# Patient Record
Sex: Female | Born: 1950
Health system: Southern US, Community
[De-identification: ages and names within clinical notes are randomized; demographics above are authoritative.]

## PROBLEM LIST (undated history)

## (undated) DIAGNOSIS — I10 Essential (primary) hypertension: Secondary | ICD-10-CM

## (undated) DIAGNOSIS — N390 Urinary tract infection, site not specified: Secondary | ICD-10-CM

## (undated) DIAGNOSIS — Z9889 Other specified postprocedural states: Secondary | ICD-10-CM

## (undated) DIAGNOSIS — Z9289 Personal history of other medical treatment: Secondary | ICD-10-CM

## (undated) DIAGNOSIS — T8859XA Other complications of anesthesia, initial encounter: Secondary | ICD-10-CM

## (undated) DIAGNOSIS — T4145XA Adverse effect of unspecified anesthetic, initial encounter: Secondary | ICD-10-CM

## (undated) DIAGNOSIS — N319 Neuromuscular dysfunction of bladder, unspecified: Secondary | ICD-10-CM

## (undated) DIAGNOSIS — R51 Headache: Secondary | ICD-10-CM

## (undated) DIAGNOSIS — M419 Scoliosis, unspecified: Secondary | ICD-10-CM

## (undated) DIAGNOSIS — R011 Cardiac murmur, unspecified: Secondary | ICD-10-CM

## (undated) DIAGNOSIS — R112 Nausea with vomiting, unspecified: Secondary | ICD-10-CM

## (undated) HISTORY — PX: OTHER SURGICAL HISTORY: SHX169

## (undated) HISTORY — PX: BACK SURGERY: SHX140

---

## 1971-11-26 HISTORY — PX: TONSILLECTOMY: SUR1361

## 1978-11-25 HISTORY — PX: OTHER SURGICAL HISTORY: SHX169

## 1988-11-25 HISTORY — PX: BREAST SURGERY: SHX581

## 1989-11-25 HISTORY — PX: ANTERIOR RELEASE VERTEBRAL BODY W/ POSTERIOR FUSION: SUR290

## 1989-11-25 HISTORY — PX: CHOLECYSTECTOMY: SHX55

## 1993-11-25 HISTORY — PX: ANTERIOR AND POSTERIOR SPINAL FUSION: SHX2259

## 2002-11-25 HISTORY — PX: DILATION AND CURETTAGE OF UTERUS: SHX78

## 2002-11-25 HISTORY — PX: HYSTEROSCOPY: SHX211

## 2003-11-07 ENCOUNTER — Other Ambulatory Visit: Payer: Self-pay

## 2003-11-26 HISTORY — PX: OTHER SURGICAL HISTORY: SHX169

## 2003-11-26 HISTORY — PX: ELBOW WEDGE OSTEOTOMY: SHX1494

## 2004-11-25 HISTORY — PX: HARDWARE REMOVAL: SHX979

## 2004-12-11 ENCOUNTER — Ambulatory Visit: Payer: Self-pay | Admitting: Unknown Physician Specialty

## 2005-05-25 ENCOUNTER — Ambulatory Visit: Payer: Self-pay | Admitting: Internal Medicine

## 2005-09-05 ENCOUNTER — Ambulatory Visit: Payer: Self-pay | Admitting: General Practice

## 2006-08-11 ENCOUNTER — Ambulatory Visit: Payer: Self-pay

## 2006-11-05 ENCOUNTER — Encounter: Payer: Self-pay | Admitting: General Practice

## 2006-11-25 ENCOUNTER — Encounter: Payer: Self-pay | Admitting: General Practice

## 2006-11-25 HISTORY — PX: JOINT REPLACEMENT: SHX530

## 2006-12-08 ENCOUNTER — Other Ambulatory Visit: Payer: Self-pay

## 2006-12-22 ENCOUNTER — Ambulatory Visit: Payer: Self-pay | Admitting: Family Medicine

## 2006-12-24 ENCOUNTER — Inpatient Hospital Stay: Payer: Self-pay | Admitting: General Practice

## 2006-12-26 ENCOUNTER — Encounter: Payer: Self-pay | Admitting: General Practice

## 2007-07-09 ENCOUNTER — Emergency Department: Payer: Self-pay

## 2007-07-09 ENCOUNTER — Other Ambulatory Visit: Payer: Self-pay

## 2007-07-16 ENCOUNTER — Ambulatory Visit: Payer: Self-pay | Admitting: Orthopedic Surgery

## 2007-09-16 ENCOUNTER — Ambulatory Visit: Payer: Self-pay | Admitting: Family Medicine

## 2007-11-27 ENCOUNTER — Ambulatory Visit: Payer: Self-pay | Admitting: Orthopedic Surgery

## 2008-07-26 ENCOUNTER — Ambulatory Visit: Payer: Self-pay

## 2008-08-16 ENCOUNTER — Ambulatory Visit: Payer: Self-pay | Admitting: Podiatry

## 2008-09-29 ENCOUNTER — Ambulatory Visit: Payer: Self-pay

## 2008-10-21 ENCOUNTER — Ambulatory Visit: Payer: Self-pay | Admitting: Unknown Physician Specialty

## 2008-11-25 HISTORY — PX: OTHER SURGICAL HISTORY: SHX169

## 2008-12-28 IMAGING — CR RIGHT HIP - COMPLETE 2+ VIEW
1 series · 1 of 1 positions shown · non-contrast
Comparison: none

REASON FOR EXAM: Postop
COMMENTS:  Bedside (portable):Y

PROCEDURE:     DXR - DXR HIP RIGHT COMPLETE  - December 24, 2006 [DATE]
RESULT:       The patient has had a RIGHT hip replacement.  There is no
evidence of fracture or dislocation.  The prosthetic device is in good
anatomic position.

[view not recorded]
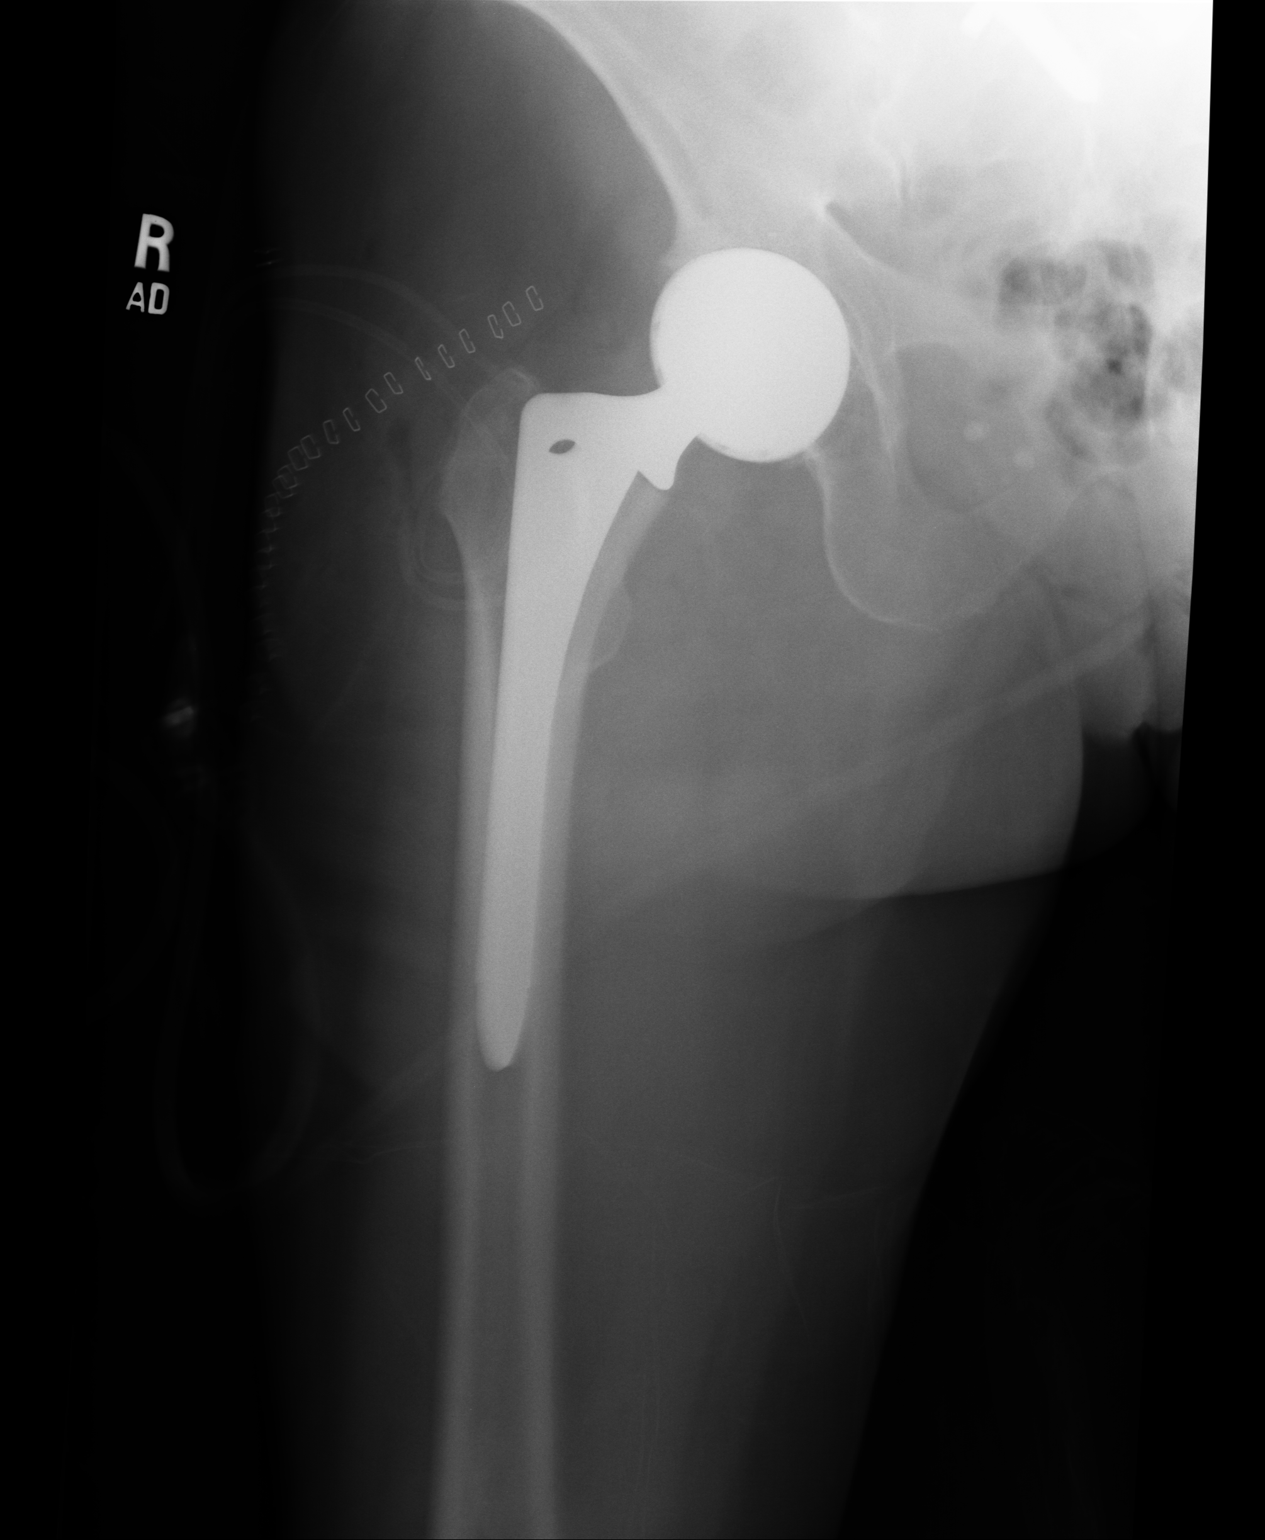

[1 of 1 positions shown; findings below may reference images not displayed]

IMPRESSION: The patient is status post RIGHT hip replacement.  Good anatomic positioning
is noted.

## 2009-01-09 ENCOUNTER — Ambulatory Visit: Payer: Self-pay

## 2009-03-13 ENCOUNTER — Ambulatory Visit: Payer: Self-pay | Admitting: Anesthesiology

## 2009-03-20 ENCOUNTER — Ambulatory Visit: Payer: Self-pay | Admitting: Internal Medicine

## 2009-03-23 ENCOUNTER — Inpatient Hospital Stay: Payer: Self-pay | Admitting: Orthopedic Surgery

## 2009-04-19 ENCOUNTER — Ambulatory Visit: Payer: Self-pay | Admitting: Anesthesiology

## 2009-10-10 ENCOUNTER — Emergency Department: Payer: Self-pay | Admitting: Emergency Medicine

## 2009-10-27 ENCOUNTER — Ambulatory Visit: Payer: Self-pay | Admitting: Podiatry

## 2009-11-22 ENCOUNTER — Ambulatory Visit: Payer: Self-pay | Admitting: Family Medicine

## 2009-11-25 HISTORY — PX: TENDON RELEASE: SHX230

## 2009-11-25 HISTORY — PX: REVISION TOTAL HIP ARTHROPLASTY: SHX766

## 2009-11-25 HISTORY — PX: ORIF METATARSAL FRACTURE: SUR942

## 2009-12-04 ENCOUNTER — Ambulatory Visit: Payer: Self-pay | Admitting: Family Medicine

## 2009-12-21 ENCOUNTER — Ambulatory Visit: Payer: Self-pay | Admitting: Podiatry

## 2010-04-26 ENCOUNTER — Ambulatory Visit: Payer: Self-pay | Admitting: Anesthesiology

## 2010-04-26 ENCOUNTER — Ambulatory Visit: Payer: Self-pay | Admitting: Podiatry

## 2010-05-14 ENCOUNTER — Ambulatory Visit: Payer: Self-pay | Admitting: Podiatry

## 2010-06-05 ENCOUNTER — Ambulatory Visit: Payer: Self-pay | Admitting: Family Medicine

## 2010-07-09 ENCOUNTER — Encounter: Payer: Self-pay | Admitting: Podiatry

## 2010-07-26 ENCOUNTER — Encounter: Payer: Self-pay | Admitting: Podiatry

## 2010-08-30 ENCOUNTER — Ambulatory Visit: Payer: Self-pay | Admitting: General Practice

## 2010-09-12 ENCOUNTER — Inpatient Hospital Stay: Payer: Self-pay | Admitting: General Practice

## 2011-07-30 ENCOUNTER — Ambulatory Visit: Payer: Self-pay | Admitting: Gastroenterology

## 2011-08-02 LAB — PATHOLOGY REPORT

## 2011-10-01 ENCOUNTER — Ambulatory Visit: Payer: Self-pay | Admitting: Family Medicine

## 2011-10-15 IMAGING — CT CT HEAD WITHOUT CONTRAST
2 series · 16 of 30 positions shown, 20 images · non-contrast
Comparison: none

REASON FOR EXAM: occipital hematoma, headache
COMMENTS:

PROCEDURE:     CT  - CT HEAD WITHOUT CONTRAST  - October 10, 2009 [DATE]
RESULT:     Technique: Helical 5mm sections were obtained from the skull
base to the vertex without administration of intravenous contrast.

[Series 2: without · axial · non-contrast · 0.44mm/px · z∈[+430,+564]mm · 13 of 33 slices shown, 17 images]
[im 3/33  brain]
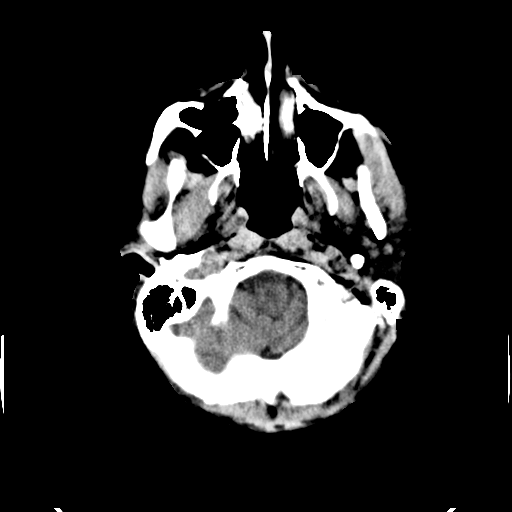
[im 3/33  bone]
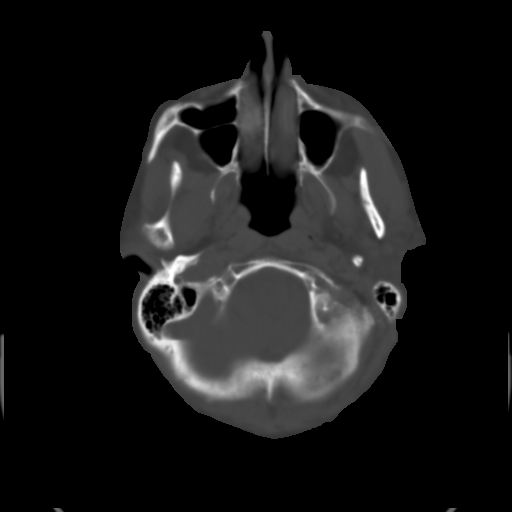
[im 5/33  brain]
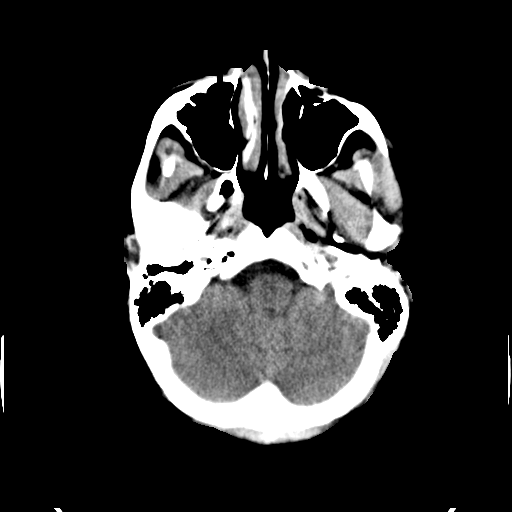
[im 7/33  brain]
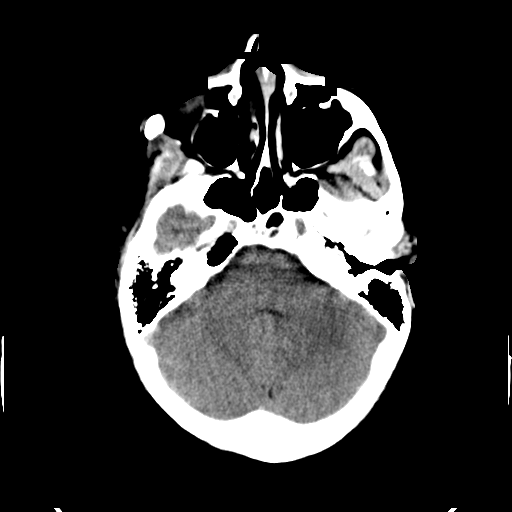
[im 10/33  brain]
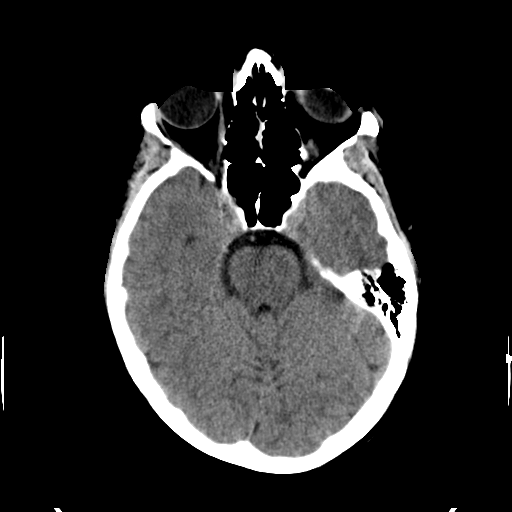
[im 12/33  brain]
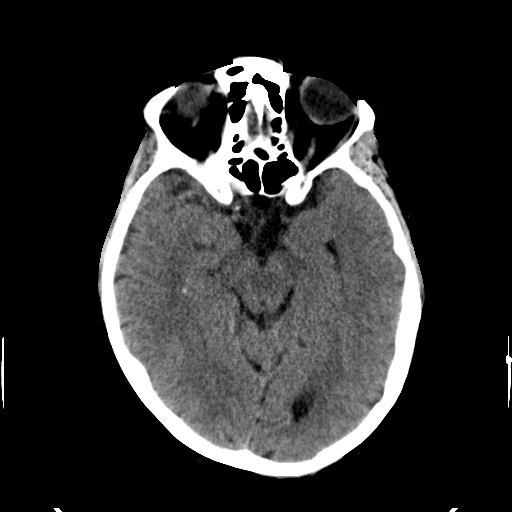
[im 12/33  bone]
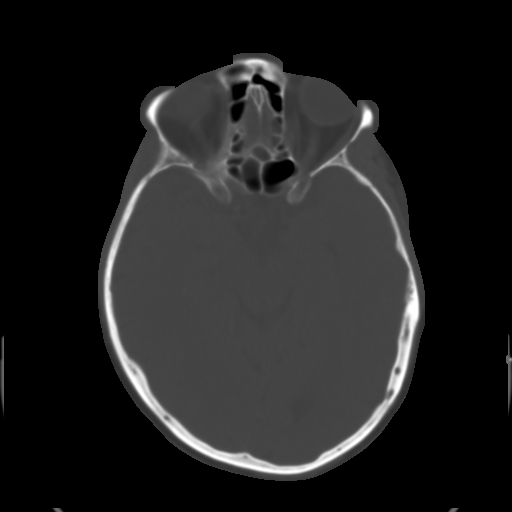
[im 14/33  brain]
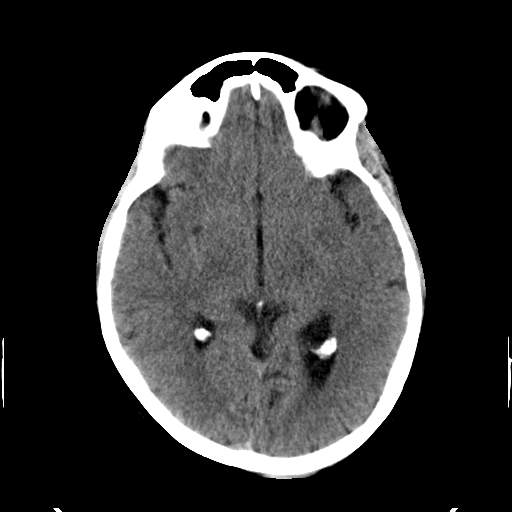
[im 17/33  brain]
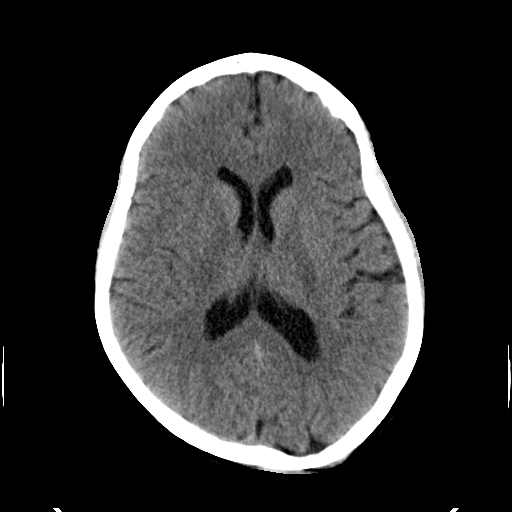
[im 19/33  brain]
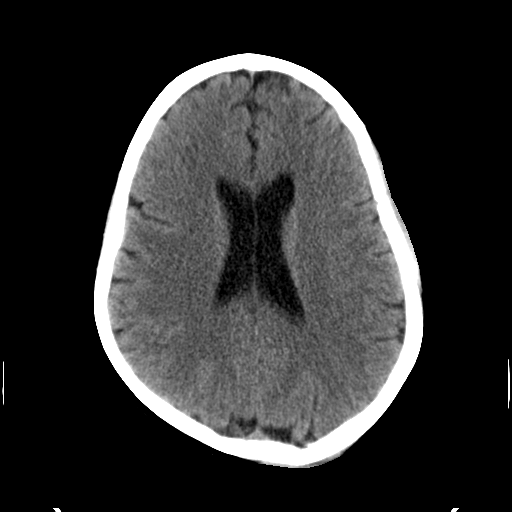
[im 21/33  brain]
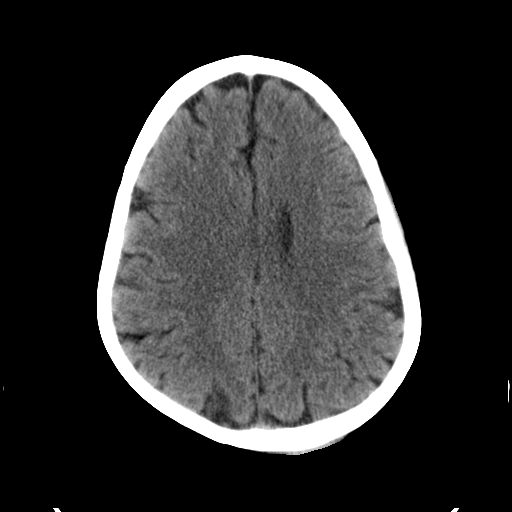
[im 21/33  bone]
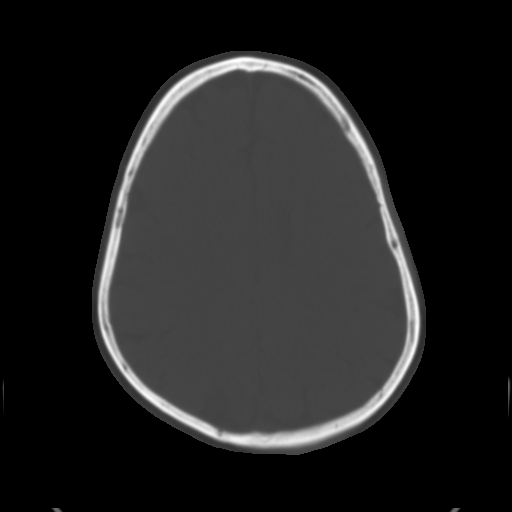
[im 23/33  brain]
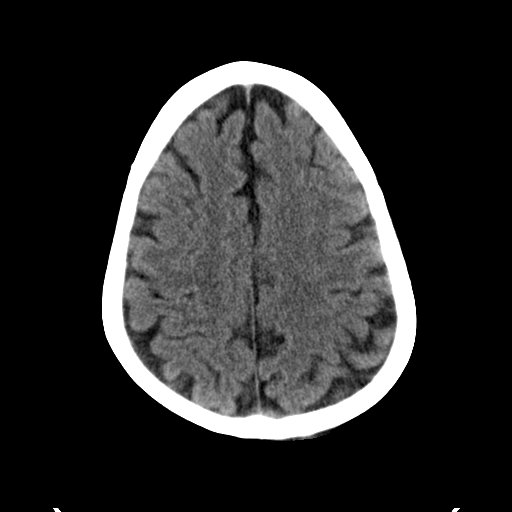
[im 26/33  brain]
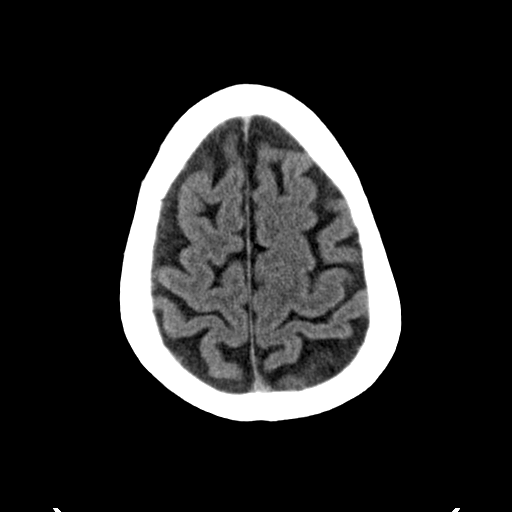
[im 28/33  brain]
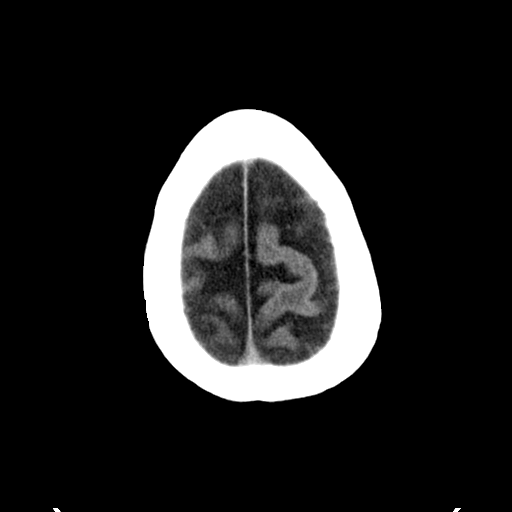
[im 30/33  brain]
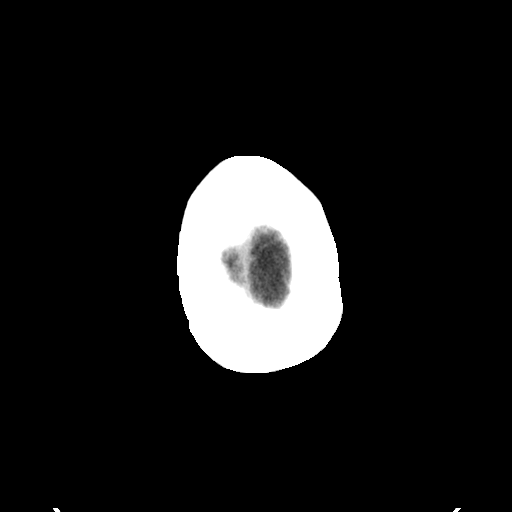
[im 30/33  bone]
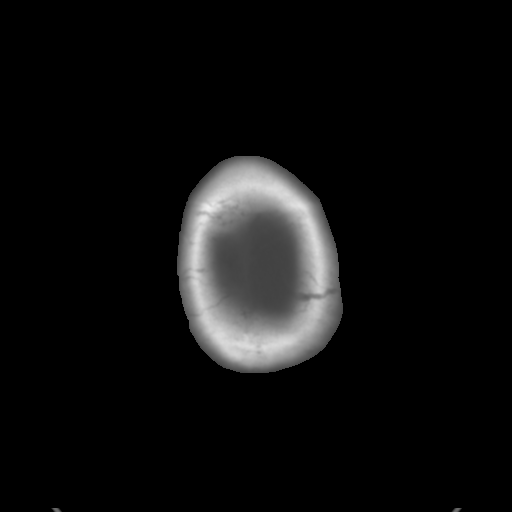

[Series 3: bone · axial · 0.44mm/px · z∈[+430,+474]mm · 3 of 33 slices shown]
[im 3/33  bone]
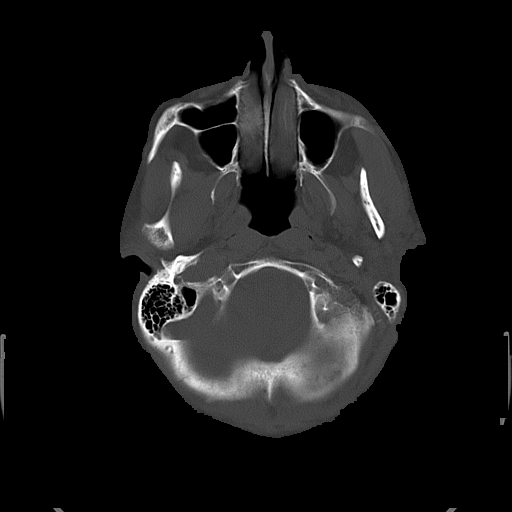
[im 7/33  bone]
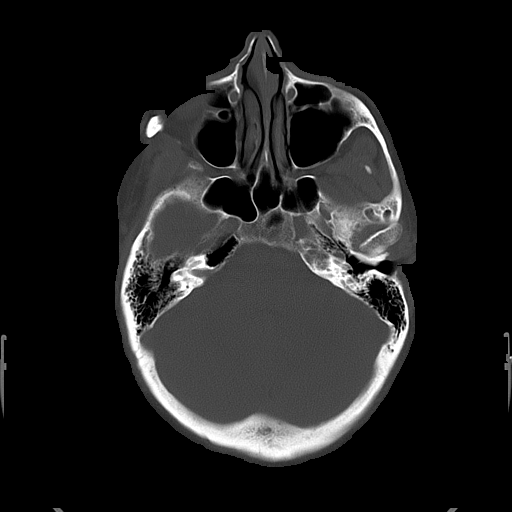
[im 12/33  bone]
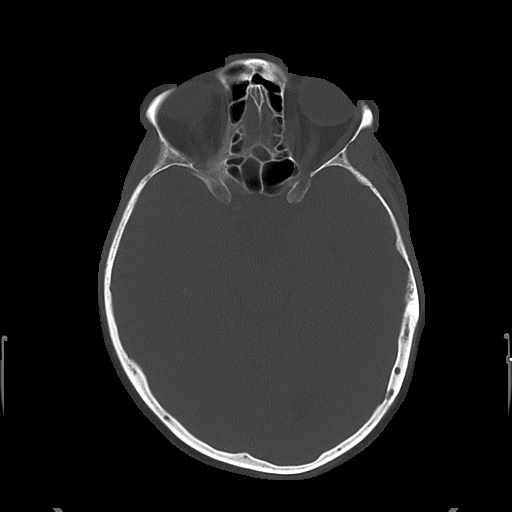

[16 of 30 positions shown; findings below may reference images not displayed]

FINDINGS: There is not evidence of intra-axial fluid collections. There is
no evidence of acute hemorrhage or secondary signs reflecting mass effect or
subacute or chronic focal territorial infarction. The osseous structures
demonstrate no evidence of a depressed skull fracture. If there is
persistent concern clinical follow-up with MRI is recommended.  A scalp
hematoma is identified than the posterior parietal region on the left.
IMPRESSION: 1. No evidence of acute intracranial abnormalitites.
2. Scalp hematoma

## 2011-11-12 ENCOUNTER — Other Ambulatory Visit: Payer: Self-pay | Admitting: Podiatry

## 2011-11-20 ENCOUNTER — Other Ambulatory Visit: Payer: Self-pay | Admitting: Podiatry

## 2011-11-26 HISTORY — PX: ANKLE FUSION: SHX881

## 2011-12-30 ENCOUNTER — Ambulatory Visit: Payer: Self-pay | Admitting: Podiatry

## 2012-01-03 ENCOUNTER — Ambulatory Visit: Payer: Self-pay | Admitting: Podiatry

## 2012-01-07 LAB — WOUND CULTURE

## 2012-01-08 LAB — PATHOLOGY REPORT

## 2012-01-24 ENCOUNTER — Inpatient Hospital Stay: Payer: Self-pay | Admitting: Podiatry

## 2012-01-24 LAB — BASIC METABOLIC PANEL
Anion Gap: 9 (ref 7–16)
Calcium, Total: 8.7 mg/dL (ref 8.5–10.1)
Chloride: 107 mmol/L (ref 98–107)
Co2: 28 mmol/L (ref 21–32)
Creatinine: 1.03 mg/dL (ref 0.60–1.30)
EGFR (African American): >60
EGFR (Non-African Amer.): 58 — ABNORMAL LOW
Glucose: 163 mg/dL — ABNORMAL HIGH (ref 65–99)

## 2012-01-24 LAB — CBC WITH DIFFERENTIAL/PLATELET
Eosinophil #: 0 10*3/uL (ref 0.0–0.7)
Eosinophil %: 0 %
HCT: 35.1 % (ref 35.0–47.0)
Lymphocyte #: 0.9 10*3/uL — ABNORMAL LOW (ref 1.0–3.6)
MCH: 29.4 pg (ref 26.0–34.0)
MCHC: 32.5 g/dL (ref 32.0–36.0)
MCV: 90 fL (ref 80–100)
Monocyte #: 0.8 10*3/uL — ABNORMAL HIGH (ref 0.0–0.7)
Monocyte %: 4.7 %
Neutrophil #: 14.7 10*3/uL — ABNORMAL HIGH (ref 1.4–6.5)
Platelet: 291 10*3/uL (ref 150–440)
RBC: 3.89 10*6/uL (ref 3.80–5.20)
RDW: 14.7 % — ABNORMAL HIGH (ref 11.5–14.5)
WBC: 16.4 10*3/uL — ABNORMAL HIGH (ref 3.6–11.0)

## 2012-01-25 LAB — CBC WITH DIFFERENTIAL/PLATELET
Basophil #: 0 10*3/uL (ref 0.0–0.1)
Basophil %: 0.3 %
Eosinophil #: 0 10*3/uL (ref 0.0–0.7)
Eosinophil %: 0.4 %
HCT: 29.5 % — ABNORMAL LOW (ref 35.0–47.0)
HGB: 9.7 g/dL — ABNORMAL LOW (ref 12.0–16.0)
Lymphocyte #: 1.4 10*3/uL (ref 1.0–3.6)
Lymphocyte %: 15.9 %
MCH: 29.4 pg (ref 26.0–34.0)
MCHC: 32.7 g/dL (ref 32.0–36.0)
MCV: 90 fL (ref 80–100)
Monocyte #: 1.3 10*3/uL — ABNORMAL HIGH (ref 0.0–0.7)
Monocyte %: 14.7 %
Neutrophil #: 5.9 10*3/uL (ref 1.4–6.5)
Neutrophil %: 68.7 %
Platelet: 225 10*3/uL (ref 150–440)
RBC: 3.28 10*6/uL — ABNORMAL LOW (ref 3.80–5.20)
RDW: 14.9 % — ABNORMAL HIGH (ref 11.5–14.5)
WBC: 8.6 10*3/uL (ref 3.6–11.0)

## 2012-01-26 LAB — CBC WITH DIFFERENTIAL/PLATELET
Basophil #: 0 10*3/uL (ref 0.0–0.1)
Eosinophil #: 0.2 10*3/uL (ref 0.0–0.7)
HCT: 28.4 % — ABNORMAL LOW (ref 35.0–47.0)
Lymphocyte #: 1.5 10*3/uL (ref 1.0–3.6)
Lymphocyte %: 18.9 %
MCHC: 32.5 g/dL (ref 32.0–36.0)
MCV: 90 fL (ref 80–100)
Monocyte #: 1.4 10*3/uL — ABNORMAL HIGH (ref 0.0–0.7)
Monocyte %: 18 %
Neutrophil #: 4.7 10*3/uL (ref 1.4–6.5)
Neutrophil %: 59.6 %
Platelet: 199 10*3/uL (ref 150–440)
RBC: 3.14 10*6/uL — ABNORMAL LOW (ref 3.80–5.20)
RDW: 15.1 % — ABNORMAL HIGH (ref 11.5–14.5)
WBC: 7.8 10*3/uL (ref 3.6–11.0)

## 2012-01-27 LAB — PATHOLOGY REPORT

## 2012-07-29 ENCOUNTER — Ambulatory Visit: Payer: Self-pay | Admitting: Pain Medicine

## 2012-08-20 ENCOUNTER — Ambulatory Visit: Payer: Self-pay | Admitting: Pain Medicine

## 2012-12-09 ENCOUNTER — Ambulatory Visit: Payer: Self-pay | Admitting: Family Medicine

## 2013-02-24 DIAGNOSIS — N3942 Incontinence without sensory awareness: Secondary | ICD-10-CM | POA: Insufficient documentation

## 2013-02-24 DIAGNOSIS — R339 Retention of urine, unspecified: Secondary | ICD-10-CM | POA: Insufficient documentation

## 2013-02-24 DIAGNOSIS — N3020 Other chronic cystitis without hematuria: Secondary | ICD-10-CM | POA: Insufficient documentation

## 2013-02-24 DIAGNOSIS — N319 Neuromuscular dysfunction of bladder, unspecified: Secondary | ICD-10-CM | POA: Insufficient documentation

## 2013-02-24 DIAGNOSIS — N3644 Muscular disorders of urethra: Secondary | ICD-10-CM | POA: Insufficient documentation

## 2013-03-01 DIAGNOSIS — Z9889 Other specified postprocedural states: Secondary | ICD-10-CM | POA: Insufficient documentation

## 2013-03-01 DIAGNOSIS — M810 Age-related osteoporosis without current pathological fracture: Secondary | ICD-10-CM | POA: Insufficient documentation

## 2013-03-01 DIAGNOSIS — Z7689 Persons encountering health services in other specified circumstances: Secondary | ICD-10-CM | POA: Insufficient documentation

## 2013-03-01 DIAGNOSIS — E039 Hypothyroidism, unspecified: Secondary | ICD-10-CM | POA: Insufficient documentation

## 2013-03-01 DIAGNOSIS — Z981 Arthrodesis status: Secondary | ICD-10-CM | POA: Insufficient documentation

## 2013-03-01 DIAGNOSIS — M419 Scoliosis, unspecified: Secondary | ICD-10-CM | POA: Insufficient documentation

## 2013-03-01 DIAGNOSIS — N319 Neuromuscular dysfunction of bladder, unspecified: Secondary | ICD-10-CM | POA: Insufficient documentation

## 2013-12-14 ENCOUNTER — Ambulatory Visit: Payer: Self-pay | Admitting: Family Medicine

## 2014-01-05 ENCOUNTER — Encounter (HOSPITAL_COMMUNITY): Payer: Self-pay | Admitting: Pharmacy Technician

## 2014-01-06 NOTE — Pre-Procedure Instructions (Signed)
Otho KetCarol F Zirbes  01/06/2014   Your procedure is scheduled on:  Thursday, Feburary 19.  Report to [facility], Main Entrance/ Entrance "A" at 11:00AM.  Call this number if you have problems the morning of surgery: [phone]   Remember:   Do not eat food or drink liquids after midnight Wednesday.  Take these medicines the morning of surgery with A SIP OF WATER: atenolol (TENORMIN). Take if needed:oxycodone (OXY-IR).     Do not wear jewelry, make-up or nail polish.  Do not wear lotions, powders, or perfumes. You may wear deodorant.  Do not shave 48 hours prior to surgery.   Do not bring valuables to the hospital.  [facility] is not responsible for any belongings or valuables.               Contacts, dentures or bridgework may not be worn into surgery.  Leave suitcase in the car. After surgery it may be brought to your room.  For patients admitted to the hospital, discharge time is determined by your treatment team.         Please read over the following fact sheets that you were given: Pain Booklet, Coughing and Deep Breathing and Surgical Site Infection Prevention

## 2014-01-06 NOTE — Progress Notes (Signed)
[facility] - Preparing for Surgery  Before surgery, you can play an important role.  Because skin is not sterile, your skin needs to be as free of germs as possible.  You can reduce the number of germs on you skin by washing with CHG (chlorahexidine gluconate) soap before surgery.  CHG is an antiseptic cleaner which kills germs and bonds with the skin to continue killing germs even after washing.  Please DO NOT use if you have an allergy to CHG or antibacterial soaps.  If your skin becomes reddened/irritated stop using the CHG and inform your nurse when you arrive at Short Stay.  Do not shave (including legs and underarms) for at least 48 hours prior to the first CHG shower.  You may shave your face.  Please follow these instructions carefully:   1.  Shower with CHG Soap the night before surgery and the morning of Surgery.  2.  If you choose to wash your hair, wash your hair first as usual with your normal shampoo.  3.  After you shampoo, rinse your hair and body thoroughly to remove the  Shampoo.  4.  Use CHG as you would any other liquid soap.  You can apply chg directly to the skin and wash gently with scrungie or a clean washcloth.  5.  Apply the CHG Soap to your body ONLY FROM THE NECK DOWN.        Do not use on open wounds or open sores.  Avoid contact with your eyes,ears, mouth and genitals (private parts).  Wash genitals (private parts)with your normal soap.  6.  Wash thoroughly, paying special attention to the area where your surgery will be performed.  7.  Thoroughly rinse your body with warm water from the neck down.  8.  DO NOT shower/wash with your normal soap after using and rinsing off the CHG Soap.  9.  Pat yourself dry with a clean towel.            10.  Wear clean pajamas.            11.  Place clean sheets on your bed the night of your first shower and do not sleep with pets.  Day of Surgery  Do not apply any lotions/deodorants the morning of surgery.  Please wear clean  clothes to the hospital/surgery center. 

## 2014-01-07 ENCOUNTER — Encounter (HOSPITAL_COMMUNITY): Payer: Self-pay

## 2014-01-07 ENCOUNTER — Ambulatory Visit (HOSPITAL_COMMUNITY)
Admission: RE | Admit: 2014-01-07 | Discharge: 2014-01-07 | Disposition: A | Discharge status: Home / Self Care | Payer: Medicare FFS | Source: Ambulatory Visit | Attending: Anesthesiology | Admitting: Anesthesiology

## 2014-01-07 ENCOUNTER — Encounter (HOSPITAL_COMMUNITY)
Admission: RE | Admit: 2014-01-07 | Discharge: 2014-01-07 | Disposition: A | Discharge status: Home / Self Care | Payer: Medicare FFS | Source: Ambulatory Visit | Attending: Orthopedic Surgery | Admitting: Orthopedic Surgery

## 2014-01-07 DIAGNOSIS — Z01818 Encounter for other preprocedural examination: Secondary | ICD-10-CM | POA: Insufficient documentation

## 2014-01-07 DIAGNOSIS — Z01812 Encounter for preprocedural laboratory examination: Secondary | ICD-10-CM | POA: Insufficient documentation

## 2014-01-07 HISTORY — DX: Urinary tract infection, site not specified: N39.0

## 2014-01-07 HISTORY — DX: Other complications of anesthesia, initial encounter: T88.59XA

## 2014-01-07 HISTORY — DX: Headache: R51

## 2014-01-07 HISTORY — DX: Other specified postprocedural states: Z98.890

## 2014-01-07 HISTORY — DX: Personal history of other medical treatment: Z92.89

## 2014-01-07 HISTORY — DX: Cardiac murmur, unspecified: R01.1

## 2014-01-07 HISTORY — DX: Neuromuscular dysfunction of bladder, unspecified: N31.9

## 2014-01-07 HISTORY — DX: Adverse effect of unspecified anesthetic, initial encounter: T41.45XA

## 2014-01-07 HISTORY — DX: Essential (primary) hypertension: I10

## 2014-01-07 HISTORY — DX: Nausea with vomiting, unspecified: R11.2

## 2014-01-07 HISTORY — DX: Scoliosis, unspecified: M41.9

## 2014-01-07 LAB — CBC
HCT: 40.5 % (ref 36.0–46.0)
Hemoglobin: 13.5 g/dL (ref 12.0–15.0)
MCH: 29.7 pg (ref 26.0–34.0)
MCHC: 33.3 g/dL (ref 30.0–36.0)
MCV: 89 fL (ref 78.0–100.0)
Platelets: 272 10*3/uL (ref 150–400)
RBC: 4.55 MIL/uL (ref 3.87–5.11)
RDW: 14.2 % (ref 11.5–15.5)
WBC: 6.2 10*3/uL (ref 4.0–10.5)

## 2014-01-07 LAB — BASIC METABOLIC PANEL
BUN: 17 mg/dL (ref 6–23)
CALCIUM: 9.5 mg/dL (ref 8.4–10.5)
CHLORIDE: 101 meq/L (ref 96–112)
CO2: 30 mEq/L (ref 19–32)
CREATININE: 1 mg/dL (ref 0.50–1.10)
GFR calc non Af Amer: 59 mL/min — ABNORMAL LOW (ref >90)
GFR, EST AFRICAN AMERICAN: 68 mL/min — AB (ref >90)
Glucose, Bld: 87 mg/dL (ref 70–99)
Potassium: 4.6 mEq/L (ref 3.7–5.3)
Sodium: 141 mEq/L (ref 137–147)

## 2014-01-12 ENCOUNTER — Other Ambulatory Visit: Payer: Self-pay | Admitting: Orthopedic Surgery

## 2014-01-12 MED ORDER — ACETAMINOPHEN 500 MG PO TABS
1000.0000 mg | ORAL_TABLET | Freq: Once | ORAL | Status: AC
  Administered 2014-01-13: 1000 mg via ORAL
  Filled 2014-01-12: qty 2

## 2014-01-12 MED ORDER — CEFAZOLIN SODIUM-DEXTROSE 2-3 GM-% IV SOLR
2.0000 g | INTRAVENOUS | Status: AC
  Administered 2014-01-13: 2 g via INTRAVENOUS
  Filled 2014-01-12: qty 50

## 2014-01-12 NOTE — H&P (Signed)
[name] is an 63 y.o. female.   Chief Complaint: Right ankle non-union arthrodesis and charcot foot HPI: Pt presents to OR for below knee amputation of the right lower extremity.  Pt had attempted arthrodesis of the right ankle performed by DPM which unfortunately failed.  Pt denies N/V/F/C, chest pain, SOB, calf pain or changes in appetite.  Past Medical History  Diagnosis Date  . Complication of anesthesia   . PONV (postoperative nausea and vomiting)   . Scoliosis   . Heart murmur     for years, not heard now  . Hypertension   . Headache(784.0)     non post menopausal  . Neuropathic bladder   . UTI (lower urinary tract infection)     Dut to neuropathic bladder  . History of blood transfusion     post back surgeries- '90's    Past Surgical History  Procedure Laterality Date  . Back surgery    . Anterior release vertebral body w/ posterior fusion  1991    2 surgeries   . Tonsillectomy  1973  . Seportinoplasty  1980  . Breast surgery Left 1990    biospy non cancer  . Cholecystectomy  1991  . Anterior and posterior spinal fusion  1995    L5- S  . Dilation and curettage of uterus  2004  . Hysteroscopy  2004  . Elbow wedge osteotomy  2005    L4  . Hardware replaced  2005  . Hardware removal  2006    T5  . P/p fusion Right 2010    2nd, 3rd, 4th  . Orif metatarsal fracture Right 2011    1 and 2  . Tendon release Right 2011  . Ankle fusion Right 2013    tendon release right 2nd toe  . Bone biospy Right     ankle  . Joint replacement Right 2008    hip  . Revision total hip arthroplasty Right 2011    No family history on file. Social History:  reports that she has never smoked. She does not have any smokeless tobacco history on file. She reports that she does not drink alcohol or use illicit drugs.  Allergies:  Allergies  Allergen Reactions  . Dilaudid [Hydromorphone] Shortness Of Breath  . Benzoin Rash  . Other Rash    "Old kind of Adhesive Tape".    No  prescriptions prior to admission    No results found for this or any previous visit (from the past 48 hour(s)). No results found.  Review of Systems  Constitutional: Negative.   HENT: Negative.   Eyes: Negative.   Respiratory: Negative.   Cardiovascular: Negative.   Gastrointestinal: Negative.   Musculoskeletal: Negative.   Skin: Negative.   Neurological: Negative for seizures.  Endo/Heme/Allergies: Negative.   Psychiatric/Behavioral: The patient is not nervous/anxious.     There were no vitals taken for this visit. Physical Exam  WD WN 63 y/o female in NAD, A/Ox3, appears stated age.  Mood and affect normal, EOMI, respirations unlabored. On physical exam pt has Charcot foot deformity to the right foot with no sensation to light touch. Skin has no ulcers or lymphadenopathy on exam. DP pulses are 1+ to the right.  Distal toes well perfused with cap refill <2 sec. Assessment/Plan Nonunion of right ankle s/p attempted arthrodesis for Charcot neuroarthropathy - to OR for R BKA.  The risks and benefits of the alternative treatment options have been discussed in detail.  The patient wishes to proceed  with surgery and specifically understands risks of bleeding, infection, nerve damage, blood clots, need for additional surgery, amputation and death.   FLOWERS, CHRISTOPHER S 01/12/2014, 2:10 PM

## 2014-01-13 ENCOUNTER — Inpatient Hospital Stay (HOSPITAL_COMMUNITY): Payer: Medicare FFS | Admitting: Anesthesiology

## 2014-01-13 ENCOUNTER — Encounter (HOSPITAL_COMMUNITY): Payer: Self-pay | Admitting: *Deleted

## 2014-01-13 ENCOUNTER — Inpatient Hospital Stay (HOSPITAL_COMMUNITY)
Admission: RE | Admit: 2014-01-13 | Discharge: 2014-01-14 | DRG: 475 | Disposition: A | Discharge status: Home / Self Care | Payer: Medicare FFS | Source: Ambulatory Visit | Attending: Orthopedic Surgery | Admitting: Orthopedic Surgery

## 2014-01-13 ENCOUNTER — Encounter (HOSPITAL_COMMUNITY): Payer: Medicare FFS | Admitting: Vascular Surgery

## 2014-01-13 ENCOUNTER — Encounter (HOSPITAL_COMMUNITY)
Admission: RE | Disposition: A | Discharge status: Home / Self Care | Payer: Self-pay | Source: Ambulatory Visit | Attending: Orthopedic Surgery

## 2014-01-13 DIAGNOSIS — M4120 Other idiopathic scoliosis, site unspecified: Secondary | ICD-10-CM | POA: Diagnosis present

## 2014-01-13 DIAGNOSIS — N319 Neuromuscular dysfunction of bladder, unspecified: Secondary | ICD-10-CM | POA: Diagnosis present

## 2014-01-13 DIAGNOSIS — Z79899 Other long term (current) drug therapy: Secondary | ICD-10-CM

## 2014-01-13 DIAGNOSIS — I10 Essential (primary) hypertension: Secondary | ICD-10-CM | POA: Diagnosis present

## 2014-01-13 DIAGNOSIS — M216X9 Other acquired deformities of unspecified foot: Secondary | ICD-10-CM | POA: Diagnosis present

## 2014-01-13 DIAGNOSIS — A5211 Tabes dorsalis: Secondary | ICD-10-CM | POA: Diagnosis present

## 2014-01-13 DIAGNOSIS — Z96649 Presence of unspecified artificial hip joint: Secondary | ICD-10-CM

## 2014-01-13 DIAGNOSIS — S88119A Complete traumatic amputation at level between knee and ankle, unspecified lower leg, initial encounter: Secondary | ICD-10-CM

## 2014-01-13 DIAGNOSIS — M14671 Charcot's joint, right ankle and foot: Secondary | ICD-10-CM | POA: Diagnosis present

## 2014-01-13 HISTORY — PX: AMPUTATION: SHX166

## 2014-01-13 SURGERY — AMPUTATION BELOW KNEE
Anesthesia: General | Site: Knee | Laterality: Right

## 2014-01-13 MED ORDER — FENTANYL CITRATE 0.05 MG/ML IJ SOLN
INTRAMUSCULAR | Status: AC
  Filled 2014-01-13: qty 5

## 2014-01-13 MED ORDER — LACTATED RINGERS IV SOLN
INTRAVENOUS | Status: DC
  Administered 2014-01-13 (×2): via INTRAVENOUS

## 2014-01-13 MED ORDER — DIPHENHYDRAMINE-APAP (SLEEP) 25-500 MG PO TABS: 2.0000 | ORAL_TABLET | Freq: Every day | ORAL | Status: DC

## 2014-01-13 MED ORDER — PROPOFOL 10 MG/ML IV BOLUS
INTRAVENOUS | Status: DC | PRN
  Administered 2014-01-13: 200 mg via INTRAVENOUS
  Administered 2014-01-13: 50 mg via INTRAVENOUS

## 2014-01-13 MED ORDER — DIPHENHYDRAMINE HCL 25 MG PO CAPS: 50.0000 mg | ORAL_CAPSULE | Freq: Every evening | ORAL | Status: DC | PRN

## 2014-01-13 MED ORDER — OXYCODONE HCL 5 MG PO TABS: 5.0000 mg | ORAL_TABLET | Freq: Four times a day (QID) | ORAL | Status: DC | PRN

## 2014-01-13 MED ORDER — BACITRACIN ZINC 500 UNIT/GM EX OINT
TOPICAL_OINTMENT | CUTANEOUS | Status: AC
  Filled 2014-01-13: qty 15

## 2014-01-13 MED ORDER — METOCLOPRAMIDE HCL 5 MG/ML IJ SOLN
5.0000 mg | Freq: Three times a day (TID) | INTRAMUSCULAR | Status: DC | PRN
  Filled 2014-01-13: qty 2

## 2014-01-13 MED ORDER — DEXAMETHASONE SODIUM PHOSPHATE 4 MG/ML IJ SOLN
INTRAMUSCULAR | Status: DC | PRN
  Administered 2014-01-13: 8 mg via INTRAVENOUS

## 2014-01-13 MED ORDER — FENTANYL CITRATE 0.05 MG/ML IJ SOLN: 25.0000 ug | INTRAMUSCULAR | Status: DC | PRN

## 2014-01-13 MED ORDER — LIDOCAINE HCL (CARDIAC) 20 MG/ML IV SOLN
INTRAVENOUS | Status: DC | PRN
  Administered 2014-01-13: 80 mg via INTRAVENOUS

## 2014-01-13 MED ORDER — SENNA 8.6 MG PO TABS
1.0000 | ORAL_TABLET | Freq: Two times a day (BID) | ORAL | Status: DC
  Administered 2014-01-13 – 2014-01-14 (×2): 8.6 mg via ORAL
  Filled 2014-01-13 (×4): qty 1

## 2014-01-13 MED ORDER — ENOXAPARIN SODIUM 30 MG/0.3ML ~~LOC~~ SOLN
30.0000 mg | SUBCUTANEOUS | Status: DC
  Administered 2014-01-14: 30 mg via SUBCUTANEOUS
  Filled 2014-01-13: qty 0.3

## 2014-01-13 MED ORDER — PROPOFOL 10 MG/ML IV BOLUS
INTRAVENOUS | Status: AC
  Filled 2014-01-13: qty 20

## 2014-01-13 MED ORDER — ARTIFICIAL TEARS OP OINT
TOPICAL_OINTMENT | OPHTHALMIC | Status: DC | PRN
  Administered 2014-01-13: 1 via OPHTHALMIC

## 2014-01-13 MED ORDER — SODIUM CHLORIDE 0.9 % IV SOLN: INTRAVENOUS | Status: DC

## 2014-01-13 MED ORDER — 0.9 % SODIUM CHLORIDE (POUR BTL) OPTIME
TOPICAL | Status: DC | PRN
  Administered 2014-01-13: 1000 mL

## 2014-01-13 MED ORDER — LIDOCAINE HCL (CARDIAC) 20 MG/ML IV SOLN
INTRAVENOUS | Status: AC
  Filled 2014-01-13: qty 5

## 2014-01-13 MED ORDER — ATENOLOL 100 MG PO TABS
100.0000 mg | ORAL_TABLET | Freq: Every evening | ORAL | Status: DC
  Administered 2014-01-13: 100 mg via ORAL
  Filled 2014-01-13 (×2): qty 1

## 2014-01-13 MED ORDER — CHLORHEXIDINE GLUCONATE 4 % EX LIQD
60.0000 mL | Freq: Once | CUTANEOUS | Status: DC
  Filled 2014-01-13: qty 60

## 2014-01-13 MED ORDER — ONDANSETRON HCL 4 MG/2ML IJ SOLN
INTRAMUSCULAR | Status: DC | PRN
Start: 2014-01-13 — End: 2014-01-13
  Administered 2014-01-13: 4 mg via INTRAVENOUS

## 2014-01-13 MED ORDER — ONDANSETRON HCL 4 MG/2ML IJ SOLN
4.0000 mg | Freq: Four times a day (QID) | INTRAMUSCULAR | Status: DC | PRN
  Filled 2014-01-13: qty 2

## 2014-01-13 MED ORDER — MIDAZOLAM HCL 2 MG/2ML IJ SOLN
INTRAMUSCULAR | Status: AC
  Filled 2014-01-13: qty 2

## 2014-01-13 MED ORDER — GLYCOPYRROLATE 0.2 MG/ML IJ SOLN
INTRAMUSCULAR | Status: DC | PRN
  Administered 2014-01-13: 0.4 mg via INTRAVENOUS

## 2014-01-13 MED ORDER — ONDANSETRON HCL 4 MG PO TABS
4.0000 mg | ORAL_TABLET | Freq: Four times a day (QID) | ORAL | Status: DC | PRN
  Filled 2014-01-13: qty 1

## 2014-01-13 MED ORDER — BUPIVACAINE LIPOSOME 1.3 % IJ SUSP
20.0000 mL | Freq: Once | INTRAMUSCULAR | Status: AC
  Administered 2014-01-13: 20 mL
  Filled 2014-01-13: qty 20

## 2014-01-13 MED ORDER — FENTANYL CITRATE 0.05 MG/ML IJ SOLN
INTRAMUSCULAR | Status: DC | PRN
  Administered 2014-01-13: 100 ug via INTRAVENOUS
  Administered 2014-01-13: 50 ug via INTRAVENOUS

## 2014-01-13 MED ORDER — GLYCOPYRROLATE 0.2 MG/ML IJ SOLN
INTRAMUSCULAR | Status: AC
  Filled 2014-01-13: qty 2

## 2014-01-13 MED ORDER — MORPHINE SULFATE 2 MG/ML IJ SOLN: 1.0000 mg | INTRAMUSCULAR | Status: DC | PRN

## 2014-01-13 MED ORDER — DOCUSATE SODIUM 100 MG PO CAPS
100.0000 mg | ORAL_CAPSULE | Freq: Two times a day (BID) | ORAL | Status: DC
  Administered 2014-01-13 – 2014-01-14 (×2): 100 mg via ORAL
  Filled 2014-01-13 (×2): qty 1

## 2014-01-13 MED ORDER — METOCLOPRAMIDE HCL 5 MG PO TABS
5.0000 mg | ORAL_TABLET | Freq: Three times a day (TID) | ORAL | Status: DC | PRN
  Filled 2014-01-13: qty 2

## 2014-01-13 MED ORDER — ENOXAPARIN SODIUM 30 MG/0.3ML ~~LOC~~ SOLN
30.0000 mg | SUBCUTANEOUS | Status: DC
  Filled 2014-01-13: qty 0.3

## 2014-01-13 MED ORDER — NITROFURANTOIN MONOHYD MACRO 100 MG PO CAPS
100.0000 mg | ORAL_CAPSULE | Freq: Every day | ORAL | Status: DC
  Administered 2014-01-13 – 2014-01-14 (×2): 100 mg via ORAL
  Filled 2014-01-13 (×2): qty 1

## 2014-01-13 MED ORDER — GABAPENTIN 300 MG PO CAPS
300.0000 mg | ORAL_CAPSULE | Freq: Every evening | ORAL | Status: DC
  Administered 2014-01-13: 300 mg via ORAL
  Filled 2014-01-13 (×3): qty 1

## 2014-01-13 MED ORDER — OXYCODONE HCL 5 MG PO CAPS
5.0000 mg | ORAL_CAPSULE | Freq: Four times a day (QID) | ORAL | Status: DC | PRN
Start: 2014-01-13 — End: 2014-01-13

## 2014-01-13 SURGICAL SUPPLY — 65 items
BANDAGE ELASTIC 6 VELCRO ST LF (GAUZE/BANDAGES/DRESSINGS) ×3 IMPLANT
BANDAGE ESMARK 6X9 LF (GAUZE/BANDAGES/DRESSINGS) IMPLANT
BLADE SAW RECIP 87.9 MT (BLADE) ×3 IMPLANT
BNDG COHESIVE 6X5 TAN STRL LF (GAUZE/BANDAGES/DRESSINGS) ×6 IMPLANT
BNDG ESMARK 6X9 LF (GAUZE/BANDAGES/DRESSINGS)
CANISTER SUCT 3000ML (MISCELLANEOUS) ×3 IMPLANT
CHLORAPREP W/TINT 26ML (MISCELLANEOUS) ×3 IMPLANT
COVER SURGICAL LIGHT HANDLE (MISCELLANEOUS) ×3 IMPLANT
CUFF TOURNIQUET SINGLE 34IN LL (TOURNIQUET CUFF) IMPLANT
CUFF TOURNIQUET SINGLE 44IN (TOURNIQUET CUFF) IMPLANT
DRAPE EXTREMITY T 121X128X90 (DRAPE) ×3 IMPLANT
DRAPE INCISE IOBAN 66X45 STRL (DRAPES) ×3 IMPLANT
DRAPE PROXIMA HALF (DRAPES) ×6 IMPLANT
DRAPE U-SHAPE 47X51 STRL (DRAPES) ×6 IMPLANT
DRSG MEPITEL 4X7.2 (GAUZE/BANDAGES/DRESSINGS) ×3 IMPLANT
DRSG PAD ABDOMINAL 8X10 ST (GAUZE/BANDAGES/DRESSINGS) ×9 IMPLANT
ELECT CAUTERY BLADE 6.4 (BLADE) IMPLANT
ELECT REM PT RETURN 9FT ADLT (ELECTROSURGICAL) ×3
ELECTRODE REM PT RTRN 9FT ADLT (ELECTROSURGICAL) ×1 IMPLANT
EVACUATOR 1/8 PVC DRAIN (DRAIN) IMPLANT
GLOVE BIO SURGEON STRL SZ7 (GLOVE) ×3 IMPLANT
GLOVE BIO SURGEON STRL SZ7.5 (GLOVE) ×3 IMPLANT
GLOVE BIO SURGEON STRL SZ8 (GLOVE) ×3 IMPLANT
GLOVE BIOGEL PI IND STRL 7.5 (GLOVE) ×1 IMPLANT
GLOVE BIOGEL PI IND STRL 8 (GLOVE) ×1 IMPLANT
GLOVE BIOGEL PI INDICATOR 7.5 (GLOVE) ×2
GLOVE BIOGEL PI INDICATOR 8 (GLOVE) ×2
GLOVE SURG SS PI 6.5 STRL IVOR (GLOVE) ×3 IMPLANT
GLOVE SURG SS PI 7.0 STRL IVOR (GLOVE) ×3 IMPLANT
GOWN STRL REUS W/ TWL LRG LVL3 (GOWN DISPOSABLE) ×1 IMPLANT
GOWN STRL REUS W/ TWL XL LVL3 (GOWN DISPOSABLE) ×1 IMPLANT
GOWN STRL REUS W/TWL LRG LVL3 (GOWN DISPOSABLE) ×2
GOWN STRL REUS W/TWL XL LVL3 (GOWN DISPOSABLE) ×2
IMMOBILIZER KNEE 22  40 CIR (ORTHOPEDIC SUPPLIES) ×2
IMMOBILIZER KNEE 22 40 CIR (ORTHOPEDIC SUPPLIES) ×1 IMPLANT
KIT BASIN OR (CUSTOM PROCEDURE TRAY) ×3 IMPLANT
KIT ROOM TURNOVER OR (KITS) ×3 IMPLANT
NEEDLE 18GX1X1/2 (RX/OR ONLY) (NEEDLE) ×3 IMPLANT
NS IRRIG 1000ML POUR BTL (IV SOLUTION) ×3 IMPLANT
PACK GENERAL/GYN (CUSTOM PROCEDURE TRAY) ×3 IMPLANT
PAD ABD 8X10 STRL (GAUZE/BANDAGES/DRESSINGS) ×6 IMPLANT
PAD ARMBOARD 7.5X6 YLW CONV (MISCELLANEOUS) ×6 IMPLANT
PAD CAST 4YDX4 CTTN HI CHSV (CAST SUPPLIES) ×1 IMPLANT
PADDING CAST COTTON 4X4 STRL (CAST SUPPLIES) ×2
PADDING CAST COTTON 6X4 STRL (CAST SUPPLIES) ×3 IMPLANT
SPONGE GAUZE 4X4 12PLY (GAUZE/BANDAGES/DRESSINGS) ×3 IMPLANT
SPONGE LAP 18X18 X RAY DECT (DISPOSABLE) IMPLANT
STAPLER VISISTAT 35W (STAPLE) IMPLANT
STOCKINETTE IMPERVIOUS LG (DRAPES) IMPLANT
SUT ETHILON 3 0 PS 1 (SUTURE) ×9 IMPLANT
SUT MNCRL 3 0 RB1 (SUTURE) ×2 IMPLANT
SUT MNCRL AB 3-0 PS2 18 (SUTURE) ×12 IMPLANT
SUT MONOCRYL 3 0 RB1 (SUTURE) ×4
SUT PDS 2 0 (SUTURE) ×6 IMPLANT
SUT PDS AB 0 CT 36 (SUTURE) ×3 IMPLANT
SUT PROLENE 3 0 PS 2 (SUTURE) ×3 IMPLANT
SUT PROLENE 3 0 SH 48 (SUTURE) IMPLANT
SUT SILK 2 0 (SUTURE) ×2
SUT SILK 2-0 18XBRD TIE 12 (SUTURE) ×1 IMPLANT
SWAB COLLECTION DEVICE MRSA (MISCELLANEOUS) IMPLANT
SYR 20CC LL (SYRINGE) ×3 IMPLANT
TOWEL OR 17X24 6PK STRL BLUE (TOWEL DISPOSABLE) ×3 IMPLANT
TOWEL OR 17X26 10 PK STRL BLUE (TOWEL DISPOSABLE) ×3 IMPLANT
TUBE ANAEROBIC SPECIMEN COL (MISCELLANEOUS) IMPLANT
WATER STERILE IRR 1000ML POUR (IV SOLUTION) ×3 IMPLANT

## 2014-01-13 NOTE — Brief Op Note (Signed)
01/13/2014  4:28 PM  PATIENT:  [name]  63 y.o. female  PRE-OPERATIVE DIAGNOSIS:  Right charcot ankle deformity with non-union  POST-OPERATIVE DIAGNOSIS:  same  Procedure(s): Right below knee amputation  SURGEON:  Toni ArthursJohn Alexas Basulto, MD  ASSISTANT: Lorin PicketScott Flowers, PA-C  ANESTHESIA:   General, regional  EBL:  minimal   TOURNIQUET:   Total Tourniquet Time Documented: Thigh (Right) - 43 minutes Total: Thigh (Right) - 43 minutes   COMPLICATIONS:  None apparent  DISPOSITION:  Extubated, awake and stable to recovery.  DICTATION ID:  [id number]

## 2014-01-13 NOTE — Anesthesia Postprocedure Evaluation (Signed)
  Anesthesia Post-op Note  Patient: [name]  Procedure(s) Performed: Procedure(s): AMPUTATION BELOW RIGHT KNEE (Right)  Patient Location: PACU  Anesthesia Type:General  Level of Consciousness: awake  Airway and Oxygen Therapy: Patient Spontanous Breathing  Post-op Pain: mild  Post-op Assessment: Post-op Vital signs reviewed  Post-op Vital Signs: Reviewed  Complications: No apparent anesthesia complications

## 2014-01-13 NOTE — Transfer of Care (Signed)
Immediate Anesthesia Transfer of Care Note  Patient: [name]  Procedure(s) Performed: Procedure(s): AMPUTATION BELOW RIGHT KNEE (Right)  Patient Location: PACU  Anesthesia Type:General  Level of Consciousness: awake and alert   Airway & Oxygen Therapy: Patient Spontanous Breathing and Patient connected to nasal cannula oxygen  Post-op Assessment: Report given to PACU RN and Post -op Vital signs reviewed and stable  Post vital signs: Reviewed and stable  Complications: No apparent anesthesia complications

## 2014-01-13 NOTE — Anesthesia Preprocedure Evaluation (Addendum)
Anesthesia Evaluation  Patient identified by MRN, date of birth, ID band Patient awake    Reviewed: Allergy & Precautions, H&P , NPO status , Patient's Chart, lab work & pertinent test results  History of Anesthesia Complications (+) PONV  Airway Mallampati: II      Dental   Pulmonary neg pulmonary ROS,  breath sounds clear to auscultation        Cardiovascular hypertension, Rhythm:Regular Rate:Normal     Neuro/Psych    GI/Hepatic negative GI ROS,   Endo/Other  negative endocrine ROS  Renal/GU      Musculoskeletal   Abdominal   Peds  Hematology   Anesthesia Other Findings   Reproductive/Obstetrics                          Anesthesia Physical Anesthesia Plan  ASA: III  Anesthesia Plan: General   Post-op Pain Management:    Induction: Intravenous  Airway Management Planned: Oral ETT  Additional Equipment:   Intra-op Plan:   Post-operative Plan: Extubation in OR  Informed Consent: I have reviewed the patients History and Physical, chart, labs and discussed the procedure including the risks, benefits and alternatives for the proposed anesthesia with the patient or authorized representative who has indicated his/her understanding and acceptance.   Dental advisory given  Plan Discussed with: CRNA, Anesthesiologist and Surgeon  Anesthesia Plan Comments:         Anesthesia Quick Evaluation

## 2014-01-14 ENCOUNTER — Encounter (HOSPITAL_COMMUNITY): Payer: Self-pay | Admitting: Orthopedic Surgery

## 2014-01-14 MED ORDER — OXYCODONE HCL 5 MG PO TABS: 5.0000 mg | ORAL_TABLET | Freq: Four times a day (QID) | ORAL | Status: DC | PRN

## 2014-01-14 NOTE — Discharge Summary (Signed)
Physician Discharge Summary  Patient ID: Otho KetCarol F Jeansonne MRN: [id number] DOB/AGE: 1950/12/14 63 y.o.  Admit date: 01/13/2014 Discharge date: 01/14/2014  Admission Diagnoses: Right Charcot arthropathy  Discharge Diagnoses: same Active Problems:   Charcot's joint of right foot   Discharged Condition: good  Hospital Course: Pt reported to OR 01/13/2014 for right below knee amputation for complications of Charcot arthropathy.  All risks and benefits of the surgery were discussed with the pt including bleeding, nerve damage, infection, blood clots, risk of other surgery and death, and pt elected to proceed with surgery.  On 01/13/2014 pt was [facility] OR for right BKA.  Procedure was performed by Dr. Victorino DikeHewitt without complication and pt was recovered in PACU and transferred to the floor for further post operative care.  During her stay, pt was given Lovenox for anticoagulation and received PT/OT evaluation for appropriate home health care.  On 01/14/2014 all vital signs were stable and pt was appropriate for discharge home.  Prognosis for the pt is good and she will f/u with Dr. Victorino DikeHewitt in 2 weeks.  Consults: None  Significant Diagnostic Studies: none  Treatments: surgery: as stated above  Discharge Exam: Blood pressure 115/72, pulse 72, temperature 97.7 F (36.5 C), temperature source Oral, resp. rate 18, height 5' 2.5" (1.588 m), weight 69.854 kg (154 lb), SpO2 98.00%. WD WN 63y/o femal in NAD, A/Ox3, appears stated age.  EOMI, mood and affect normal, respirations unlabored.  On physical exam, dressing saturated distally but remains in proper placement.. Dressing taken down.  Bleeding across incision noted.  Decreased sensation noted to distal stump.  Distal stump well perfused with cap refill <2sec. Incision cleaned and redressed.    Disposition: Final discharge disposition not confirmed  Discharge Orders   Future Orders Complete By Expires   Call MD / Call 911  As directed     Comments:     If you experience chest pain or shortness of breath, CALL 911 and be transported to the hospital emergency room.  If you develope a fever above 101 F, pus (white drainage) or increased drainage or redness at the wound, or calf pain, call your surgeon's office.   Constipation Prevention  As directed    Comments:     Drink plenty of fluids.  Prune juice may be helpful.  You may use a stool softener, such as Colace (over the counter) 100 mg twice a day.  Use MiraLax (over the counter) for constipation as needed.   Diet - low sodium heart healthy  As directed    Driving restrictions  As directed    Comments:     No driving   Increase activity slowly as tolerated  As directed    Lifting restrictions  As directed    Comments:     No lifting for 6 weeks       Medication List         atenolol 100 MG tablet  Commonly known as:  TENORMIN  Take 100 mg by mouth every evening.     CALCIUM 600 + D PO  Take 2 tablets by mouth at bedtime.     diphenhydramine-acetaminophen 25-500 MG Tabs  Commonly known as:  TYLENOL PM  Take 2 tablets by mouth at bedtime.     gabapentin 300 MG capsule  Commonly known as:  NEURONTIN  Take 300 mg by mouth every evening.     nitrofurantoin (macrocrystal-monohydrate) 100 MG capsule  Commonly known as:  MACROBID  Take 100  mg by mouth daily.     oxycodone 5 MG capsule  Commonly known as:  OXY-IR  Take 5 mg by mouth every 6 (six) hours as needed for pain.     oxyCODONE 5 MG immediate release tablet  Commonly known as:  Oxy IR/ROXICODONE  Take 1 tablet (5 mg total) by mouth every 6 (six) hours as needed for moderate pain or severe pain.           Follow-up Information   Follow up with HEWITT, Jonny Ruiz, MD. Schedule an appointment as soon as possible for a visit in 2 weeks.   Specialty:  Orthopedic Surgery   Contact information:   [address] [place] [address] [phone]       Signed: Hartford Poli 01/14/2014, 9:33 AM

## 2014-01-14 NOTE — Discharge Instructions (Signed)
Toni ArthursJohn Hewitt, MD [facility]  Please read the following information regarding your care after surgery.  Medications  You only need a prescription for the narcotic pain medicine (ex. oxycodone, Percocet, Norco).  All of the other medicines listed below are available over the counter. X acetominophen (Tylenol) 650 mg every 4-6 hours as you need for minor pain X oxycodone as prescribed for moderate to severe pain   Narcotic pain medicine (ex. oxycodone, Percocet, Vicodin) will cause constipation.  To prevent this problem, take the following medicines while you are taking any pain medicine. X docusate sodium (Colace) 100 mg twice a day X senna (Senokot) 2 tablets twice a day  X To help prevent blood clots, take an aspirin (325 mg) once a day for a month after surgery.  You should also get up every hour while you are awake to move around.    Weight Bearing X Do not bear any weight on the operated leg or foot.  Cast / Splint / Dressing X Keep your dressing clean and dry.  Dont put anything (coat hanger, pencil, etc) down inside of it.  If it gets damp, use a hair dryer on the cool setting to dry it.  If it gets soaked, call the office to schedule an appointment for a cast change.    Swelling It is normal for you to have swelling where you had surgery.  To reduce swelling and pain, keep your toes above your nose for at least 3 days after surgery.  It may be necessary to keep your foot or leg elevated for several weeks.  If it hurts, it should be elevated.  Follow Up Call my office at [phone] when you are discharged from the hospital or surgery center to schedule an appointment to be seen two weeks after surgery.  Call my office at [phone] if you develop a fever >101.5 F, nausea, vomiting, bleeding from the surgical site or severe pain.

## 2014-01-14 NOTE — Progress Notes (Signed)
OT Cancellation Note  Patient Details Name: [name] MRN: [id number] DOB: November 23, 1951   Cancelled Treatment:    Reason Eval/Treat Not Completed: OT screened, no needs identified, will sign off.    01/14/2014 Cipriano MileJohnson, Jenna Elizabeth OTR/L Pager [phone] Office [phone]

## 2014-01-14 NOTE — Op Note (Signed)
NAMEGRACEY, [name] NO.:  [id number]  MEDICAL RECORD NO.:  [id number]  LOCATION:  [id number]                        FACILITY:  [facility]  PHYSICIAN:  Toni Arthurs, MD        DATE OF BIRTH:  [birth year]  DATE OF PROCEDURE:  01/13/2014 DATE OF DISCHARGE:                              OPERATIVE REPORT   PREOPERATIVE DIAGNOSIS:  Right Charcot ankle deformity with nonunion, status post attempted arthrodesis.  POSTOPERATIVE DIAGNOSIS:  Right Charcot ankle deformity with nonunion, status post attempted arthrodesis.Marland Kitchen  PROCEDURE:  Right below-knee amputation.  SURGEON:  Toni Arthurs, MD  ASSISTANT:  Lorin Picket Flowers, PA-C.  ANESTHESIA:  General, regional.  ESTIMATED BLOOD LOSS:  Minimal.  TOURNIQUET TIME:  43 minutes at 250 mmHg.  COMPLICATIONS:  None.  DISPOSITION:  Extubated awake and stable to recovery.  INDICATIONS FOR PROCEDURE:  The patient is a 63 year old woman with past medical history significant for idiopathic neuropathy and a right Charcot ankle.  She attempted arthrodesis of the ankle and subtalar joint with a TTC nail last year.  She failed to heal and has had progressive destruction of her talus.  We had a long discussion about the treatment options for this challenging condition.  We discussed the risks and benefits, the alternative treatment options in detail.  She elects below-knee amputation for this painful limiting condition.  She understands the risks and benefits, the alternative treatment options, and elects surgical treatment.  She specifically understands risks of bleeding, infection, nerve damage, blood clots, need for additional surgery, revision amputation, and death.  PROCEDURE IN DETAIL:  After preoperative consent was obtained, the correct operative site was identified.  The patient was brought to the operating room and placed supine on the operating table.  General anesthesia was induced.  Preoperative antibiotics were  administered. Surgical time-out was taken.  Right lower extremity was prepped and draped in standard sterile fashion with a tourniquet around the thigh. The extremity was exsanguinated.  Tourniquet was inflated to 250 mmHg. A posterior flap incision was marked on the skin approximately 15 cm distal from the medial joint line.  The incision was made, sharp dissection was carried down through the skin and subcutaneous tissue to the level of the anterior tibia.  The periosteum was elevated.  The reciprocating saw was used to cut the tibia, approximately 1 cm proximal from the skin incision.  The anterior compartment musculature was then cut and the fibula was dissected.  The fibula was then cut proximally distally 2 cm proximal from the tibial cut.  The tibia was then retracted anteriorly with a bone hook and the amputation knife was used to cut through the posterior soft tissues contouring the flap appropriately.  The distal tibia and fibula were smoothed with a saw and a rasp.  The tibial nerve and peroneal nerve were infiltrated with half Exparel and half normal saline.  All of the named nerves were then cut with a 15 blade while being held on gentle traction.  All the named vascular structures were doubly suture-ligated with 2-0 silk ties.  The wound was then irrigated copiously.  Posterior flap was repaired to the anterior periosteum with imbricating sutures  of 0 PDS.  The superficial fascia was repaired with simple sutures of 0 PDS.  The subcutaneous tissue was approximated with inverted simple sutures of 3-0 Monocryl, and horizontal mattress sutures of 3-0 nylon were used to close the skin incision.  Sterile dressings were applied and followed by compression wrap and a knee immobilizer.  The tourniquet was released at 43 minutes after application of the dressings.  The patient was awakened by anesthesia and transported to the recovery room in stable condition.  FOLLOWUP PLAN:  The  patient will be nonweightbearing on the right lower extremity.  She will be fit for a stump protector.  Tomorrow she will be evaluated by physical therapy and occupational therapy.  She will start on aspirin for DVT prophylaxis.  Scott Flowers, PA-C was present and scrubbed for the duration of the case.  His assistance was essentially gaining and maintaining exposure, perform the operation, closing and dressing the wounds, and applying the knee immobilizer.     Toni ArthursJohn Kerrigan Glendening, MD     JH/MEDQ  D:  01/13/2014  T:  01/14/2014  Job:  [id number]

## 2014-01-14 NOTE — Care Management Note (Signed)
CARE MANAGEMENT NOTE 01/14/2014  Patient:  [name],[name]   Account Number:  [id number]  Date Initiated:  01/14/2014  Documentation initiated by:  Vance PeperBRADY,Camila [place]  Subjective/Objective Assessment:   63 yr old female s/p right BKA     Action/Plan:   Case Manager spoke with patient concerning home health and DME needs. Patient states she is fine, has crutches and has been dealing with this for 4 yrs and has adjusted. Amazing lady.Going Home with friend for wkend.   Anticipated DC Date:  01/14/2014   Anticipated DC Plan:  HOME/SELF CARE      DC Planning Services  CM consult      PAC Choice  NA   Choice offered to / List presented to:  C-1 Patient   DME arranged  NA        HH arranged  NA      Status of service:  Completed, signed off Medicare Important Message given?   (If response is "NO", the following Medicare IM given date fields will be blank) Date Medicare IM given:   Date Additional Medicare IM given:    Discharge Disposition:  HOME/SELF CARE

## 2014-01-14 NOTE — Progress Notes (Signed)
Subjective: 1 Day Post-Op Procedure(s) (LRB): AMPUTATION BELOW RIGHT KNEE (Right) Patient doing very well this AM.  She is able to ambulate well with her crutches.  Pain is well controlled.  Pt denies N/V/F/C, chest pain, SOB, or changes in appetite.  Objective: Vital signs in last 24 hours: Temp:  [97.4 F (36.3 C)-97.7 F (36.5 C)] 97.7 F (36.5 C) (02/20 0522) Pulse Rate:  [50-80] 72 (02/20 0522) Resp:  [10-20] 18 (02/20 0522) BP: (115-166)/(68-90) 115/72 mmHg (02/20 0522) SpO2:  [93 %-100 %] 98 % (02/20 0522) Weight:  [69.854 kg (154 lb)] 69.854 kg (154 lb) (02/20 0100)  Intake/Output from previous day: 02/19 0701 - 02/20 0700 In: 1500 [I.V.:1500] Out: 50 [Blood:50] Intake/Output this shift:    No results found for this basename: HGB,  in the last 72 hours No results found for this basename: WBC, RBC, HCT, PLT,  in the last 72 hours No results found for this basename: NA, K, CL, CO2, BUN, CREATININE, GLUCOSE, CALCIUM,  in the last 72 hours No results found for this basename: LABPT, INR,  in the last 72 hours  WD WN 63y/o femal in NAD, A/Ox3, appears stated age.  EOMI, mood and affect normal, respirations unlabored.  On physical exam, dressing saturated distally but remains in proper placement.. Dressing taken down.  Bleeding across incision noted.  Decreased sensation noted to distal stump.  Distal stump well perfused with cap refill <2sec. Incision cleaned and redressed.    Assessment/Plan: 1 Day Post-Op Procedure(s) (LRB): AMPUTATION BELOW RIGHT KNEE (Right) Discharge pt home with appropriate home health care if needed as dictated by physical therapy.  Continue Aspirin 325mg  one tablet per day x 6weeks.  Continue NWB on RLE.  Continue use of stump protector and stump shrinker sock.  F/u with Dr Victorino DikeHewitt in 2 weeks.    FLOWERS, CHRISTOPHER S 01/14/2014, 9:17 AM

## 2014-01-14 NOTE — Evaluation (Signed)
Physical Therapy Evaluation Patient Details Name: [name] MRN: [id number] DOB: 1951-08-31 Today's Date: 01/14/2014 Time: 4098-1191 PT Time Calculation (min): 16 min  PT Assessment / Plan / Recommendation History of Present Illness  Pt is a 63 yo female s/p below knee amputation of the right lower extremity.  Pt had attempted arthrodesis of the right ankle which unfortunately failed.  Patient has been using crutches for 4 years with non-union fracture/Charcot foot.  Clinical Impression  Patient moving well with crutches today.  Demonstrates safe technique.  Recommend OP PT for pre-prosthetic training and for prosthetic training once obtained. (spoke with patient about OP PT - she prefers [facility] - closer to home.)    PT Assessment  All further PT needs can be met in the next venue of care    Follow Up Recommendations  Supervision - Intermittent;Outpatient PT (OP PT for pre-prosthetic training and with prosthesis.)    Does the patient have the potential to tolerate intense rehabilitation      Barriers to Discharge        Equipment Recommendations  None recommended by PT    Recommendations for Other Services     Frequency      Precautions / Restrictions Precautions Precautions: None Required Braces or Orthoses: Knee Immobilizer - Right Restrictions Weight Bearing Restrictions: Yes RLE Weight Bearing: Non weight bearing   Pertinent Vitals/Pain       Mobility  Transfers Overall transfer level: Modified independent Equipment used: Crutches General transfer comment: Patient demonstrates safe transfers with use of crutches. Ambulation/Gait Ambulation/Gait assistance: Modified independent (Device/Increase time) Ambulation Distance (Feet): 94 Feet Assistive device: Crutches Gait Pattern/deviations: Step-to pattern (with crutches)    Exercises Amputee Exercises Quad Sets: AROM;Right;5 reps;Seated   PT Diagnosis: Abnormality of gait  PT Problem List: Decreased  strength;Decreased balance;Pain PT Treatment Interventions:       PT Goals(Current goals can be found in the care plan section)    Visit Information  Last PT Received On: 01/14/14 Assistance Needed: +1 History of Present Illness: Pt is a 63 yo female s/p below knee amputation of the right lower extremity.  Pt had attempted arthrodesis of the right ankle which unfortunately failed.  Patient has been using crutches for 4 years with non-union fracture/Charcot foot.       Prior [place] expects to be discharged to:: Private residence Living Arrangements: Spouse/significant other Available Help at Discharge: Family;Friend(s);Available 24 hours/day (Home with friend for several days at d/c. Husband works.) Type of Home: House Home Access: Stairs to enter Technical brewer of Steps: 2 Entrance Stairs-Rails: None Home Layout: One level Home Equipment: Crutches (Kneeling walker) Prior Function Level of Independence: Independent with assistive device(s) (Has been using crutches for 4 years. Very independent/active) Communication Communication: No difficulties    Cognition  Cognition Arousal/Alertness: Awake/alert Behavior During Therapy: WFL for tasks assessed/performed Overall Cognitive Status: Within Functional Limits for tasks assessed    Extremity/Trunk Assessment Upper Extremity Assessment Upper Extremity Assessment: Overall WFL for tasks assessed Lower Extremity Assessment Lower Extremity Assessment: RLE deficits/detail RLE Deficits / Details: s/p Rt BKA.  Able to lift RLE against gravity. Cervical / Trunk Assessment Cervical / Trunk Assessment: Other exceptions Cervical / Trunk Exceptions: H/o scoliosis - surgery   Balance Balance Overall balance assessment: Modified Independent (with crutches)  End of Session PT - End of Session Equipment Utilized During Treatment: Gait belt;Right knee immobilizer Activity Tolerance: Patient tolerated  treatment well Patient left: in chair;with call bell/phone within reach Nurse  Communication: Mobility status  GP     Despina Pole 01/14/2014, 1:57 PM Carita Pian. Sanjuana Kava, [place] Pager [phone]

## 2014-01-14 NOTE — Progress Notes (Signed)
Utilization review completed.  

## 2014-01-14 NOTE — Progress Notes (Signed)
Pt discharged to home. D/c instructions given, no questions verbalized. Vitals stable. 

## 2014-01-14 NOTE — Progress Notes (Signed)
Orthopedic Tech Progress Note Patient Details:  [name] 03-May-1951 [id number] [facility] called for order per request. Spoke with Brett CanalesSteve; patient to be fitted later today Patient ID: [name], female   DOB: 03-May-1951, 63 y.o.   MRN: [id number]   [name] 01/14/2014, 9:41 AM

## 2014-01-31 ENCOUNTER — Encounter: Payer: Self-pay | Admitting: Surgery

## 2014-02-02 ENCOUNTER — Encounter (HOSPITAL_COMMUNITY): Payer: Self-pay | Admitting: Orthopedic Surgery

## 2014-02-02 NOTE — Addendum Note (Signed)
Addendum created 02/02/14 1804 by Judie Petitharlene Edwards, MD   Modules edited: Anesthesia Events

## 2014-02-09 ENCOUNTER — Encounter: Payer: Self-pay | Admitting: Surgery

## 2014-02-23 ENCOUNTER — Encounter: Payer: Self-pay | Admitting: Surgery

## 2014-03-06 ENCOUNTER — Emergency Department: Payer: Self-pay | Admitting: Emergency Medicine

## 2014-03-25 ENCOUNTER — Encounter: Payer: Self-pay | Admitting: Surgery

## 2014-12-15 ENCOUNTER — Ambulatory Visit: Payer: Self-pay | Admitting: Internal Medicine

## 2014-12-15 ENCOUNTER — Ambulatory Visit: Payer: Self-pay | Admitting: Family Medicine

## 2014-12-15 LAB — CBC CANCER CENTER
BASOS ABS: 1 %
COMMENT - H1-COM3: NORMAL
Eosinophil: 2 %
HCT: 34.1 % — AB (ref 35.0–47.0)
HGB: 11.2 g/dL — AB (ref 12.0–16.0)
MCH: 29.4 pg (ref 26.0–34.0)
MCHC: 32.8 g/dL (ref 32.0–36.0)
MCV: 90 fL (ref 80–100)
Monocytes: 9 %
Platelet: 364 x10 3/mm (ref 150–440)
RBC: 3.8 10*6/uL (ref 3.80–5.20)
RDW: 15.6 % — ABNORMAL HIGH (ref 11.5–14.5)
SEGMENTED NEUTROPHILS: 60 %
Variant Lymphocyte: 28 %
WBC: 6 x10 3/mm (ref 3.6–11.0)

## 2014-12-15 LAB — RETICULOCYTES
Absolute Retic Count: 0.05 10*6/uL (ref 0.019–0.186)
RETICULOCYTE: 1.3 % (ref 0.4–3.1)

## 2014-12-19 LAB — OCCULT BLOOD X 1 CARD TO LAB, STOOL
OCCULT BLOOD, FECES: NEGATIVE
Occult Blood, Feces: NEGATIVE

## 2014-12-19 LAB — URINE IEP, RANDOM

## 2014-12-21 LAB — PROT IMMUNOELECTROPHORES(ARMC)

## 2014-12-26 ENCOUNTER — Ambulatory Visit: Payer: Self-pay | Admitting: Internal Medicine

## 2015-03-07 ENCOUNTER — Ambulatory Visit
Admit: 2015-03-07 | Disposition: A | Discharge status: Home / Self Care | Payer: Self-pay | Attending: Internal Medicine | Admitting: Internal Medicine

## 2015-03-07 LAB — CBC CANCER CENTER
BASOS ABS: 0.2 x10 3/mm — AB (ref 0.0–0.1)
Basophil %: 3.4 %
EOS ABS: 0.3 x10 3/mm (ref 0.0–0.7)
EOS PCT: 7 %
HCT: 37.8 % (ref 35.0–47.0)
HGB: 12.2 g/dL (ref 12.0–16.0)
LYMPHS PCT: 32.9 %
Lymphocyte #: 1.6 x10 3/mm (ref 1.0–3.6)
MCH: 29 pg (ref 26.0–34.0)
MCHC: 32.3 g/dL (ref 32.0–36.0)
MCV: 90 fL (ref 80–100)
MONO ABS: 0.6 x10 3/mm (ref 0.2–0.9)
Monocyte %: 12.4 %
NEUTROS PCT: 44.3 %
Neutrophil #: 2.1 x10 3/mm (ref 1.4–6.5)
Platelet: 317 x10 3/mm (ref 150–440)
RBC: 4.21 10*6/uL (ref 3.80–5.20)
RDW: 13.4 % (ref 11.5–14.5)
WBC: 4.8 x10 3/mm (ref 3.6–11.0)

## 2015-03-07 LAB — IRON AND TIBC
IRON BIND. CAP.(TOTAL): 367 (ref 250–450)
Iron Saturation: 12.5
Iron: 46 ug/dL
Unbound Iron-Bind.Cap.: 321.1

## 2015-03-07 LAB — FERRITIN: Ferritin (ARMC): 72 ng/mL

## 2015-03-19 NOTE — Op Note (Signed)
PATIENT NAME:  [name], [name] MR#:  [id number] DATE OF BIRTH:  08/11/1951  DATE OF PROCEDURE:  01/24/2012  PREOPERATIVE DIAGNOSIS: Unstable right ankle with ulceration and avascular necrosis.   POSTOPERATIVE DIAGNOSIS: Unstable right ankle with ulceration and avascular necrosis.   PROCEDURE: Ankle fusion and subtalar fusion with femoral head interpositional bone graft.   SURGEON: Senta Kantor A. Ether GriffinsFowler, DPM   ANESTHESIA: General.   HEMOSTASIS: Thigh tourniquet inflated to 325 mmHg for 107 minutes, let down for 20 minutes, and then reinflated for 111 minutes.   COMPLICATIONS: None.   SPECIMEN: Avascular talus for pathology.   OPERATIVE INDICATIONS: This is a 64 year old female who developed a Charcot ankle with an avascular talus with complete instability of the ankle joint. She had developed a lateral ankle ulceration which we were able to heal up. She still had a very unstable ankle. We have had bone biopsies of the fibula and talus, which were negative for any signs of infection. She presents today for surgical stabilization of the ankle. All risks, benefits, alternatives, and complications associated with surgery were discussed with the patient and informed consent was given.   OPERATIVE PROCEDURE: The patient was brought into the Operating Room and placed on the operating table in the supine position. General intubation was administered by the anesthesia team. The right lower extremity was prepped and draped in the usual sterile fashion, from the foot to the knee. After inflation of the tourniquet, attention was directed laterally. The fibula was dissected away. A beveled cut of the fibula was made approximately 3 to 4 cm proximal to the ankle joint. This was removed from the surgical field in toto. At this time, there was noted to be small particles of the residual talus. This was removed from the surgical field in toto. At this time, the distal tibia was then removed of any articular cartilage,  which minimal was noted. The superior calcaneus was also removed of any articular cartilage down to good healthy bleeding bone. The talectomy was performed prior to removal of all this. Next, the tibial and calcaneal areas were then debrided away with the use of an osteotome, bur, and 1.5 mm drill bit to prepare the chondral surfaces. Next, a fresh frozen femoral head had been removed from the freezer and allowed to thaw in saline. This was cut to fit the defect noted in the ankle joint. A femoral head was cut to approximately 2.5 cm in height, 3 cm in length, and 3 cm in width. This was further contoured with a power bur and power saw to fit nicely into the tibial and calcaneal components. Further bone graft was then placed with Trinity bone graft covering the entire body and surrounding surfaces of the femoral head that had been prepared with 2 mm drills. This was temporarily stabilized with 0.062 K wires. Further contouring was noted to good solid anatomically aligned position with the heel in neutral to the tibia with the foot slightly externally rotated. The tibial crest was in line with the second ray. After temporary stabilization with K wires, the guidewire for the intramedullary nail from Orthofix was introduced. Good alignment was noted in all planes. The incision was made to the plantar aspect of the heel and dissection was carried down bluntly to the plantar calcaneus. Neurovascular bundles and soft tissue were held out of the way during the entire procedure. Next, the sleeve for the intramedullary nail was placed into the plantar heel and then the 7, 9, and 13 mm reamer  was placed from the calcaneus crossing the femoral graft into the tibia. Next, the guidewire was removed and the ball pin wire was placed. Sequential reamers were then placed with a flexible reamer ending at 11 mm reaming, in the proximal tibia. A 200 mm x 10 mm nail was then picked to place into this area. It was placed with the locking  jig and placed into the calcaneus crossing the femoral graft into the tibia. This was noted to be in excellent position in multiple planes of fluoroscopy. The tibial screw was placed from lateral to medial into the femoral graft and was nice and stable. Two proximal tibial screws were placed into the proximal tibia entering the nail nicely. A posterior to anterior screw was placed from the posterior calcaneus into the nail ending at about the calcaneocuboid joint just proximal to this. A final screw was placed from posterior plantar to the femoral component for good stability of the new subtalar joint that had been placed. All screws were from the Ortho Helix set and were 5.0 mm in size. The talar screw was 40 mm. The two tibial screws were 25 mm. The calcaneal posterior to anterior screw was 70 mm. The subtalar screw was 60 mm. Good stability of all areas was noted. Prior to final placement of the hardware and locking, as well as prior to actual placement of the nail, Trinity bone graft was placed in the ankle and subtalar site areas. Further packing with graft from the calcaneus and tibia was also placed. The fibula was removed and was not used as bone graft. After final placement of all hardware, small areas of bone grafting were then placed anterior and posterior of the new ankle joint as well as in the subtalar joint as I was able to place. Wound closure was then performed with 3-0 and 4-0 Vicryl for all the deeper layers. The skin was reanastomosed with a combination of 3-0 nylon for the small screw placements and a skin staple to the lateral incision site. A small Hemovac drain was placed, in the lateral incision site, on the right ankle. She tolerated the procedure and anesthesia well and was transported from the Operating Room to the PACU with all vital signs stable and neurovascular status intact.   I will see her tomorrow. She will be admitted for postoperative pain management, for 24 hours of  antibiotics, and we will get her set up with physical therapy to begin nonweightbearing ambulation. ____________________________ [place] Donovan Ether Griffins, DPM jaf:slb D: 01/24/2012 12:55:08 ET T: 01/24/2012 13:16:43 ET JOB#: [id number]  cc: Jill Alexanders A. Ether Griffins, DPM, <Dictator> Rand Etchison DPM ELECTRONICALLY SIGNED 02/07/2012 15:12

## 2015-03-19 NOTE — Consult Note (Signed)
Brief Consult Note: Diagnosis: Hypertension management, peripheral neuropathy s/p arthrodesis of right ankle, chronic back pain.   Patient was seen by consultant.   Consult note dictated.   Orders entered.   Comments: 64y/o with PMH of HTN, peripheral neuropathy with right ankle charcots joint s/p arthrodesis today, medical consult for HTN mgmt  * HTN- pt started on losartan recently by PCP and also on atenelol and her home BP mgmt shows good control with taking them bid- would continue thta. low sodium diet advised and f/u with PCP also check BMP for electrolytes and cr  * Chronic low back pain- on pain meds  * Arthrodesis of right ankle joint- mgmt per podiatry, pain mgmt, PT consult.  Electronic Signatures: Enid BaasKalisetti, Lissandro Dilorenzo (MD)  (Signed 01-Mar-13 18:16)  Authored: Brief Consult Note   Last Updated: 01-Mar-13 18:16 by Enid BaasKalisetti, Kaidence Callaway (MD)

## 2015-03-19 NOTE — Consult Note (Signed)
PATIENT NAME:  [name], STRATTON MR#:  [id number] DATE OF BIRTH:  05/03/1951  DATE OF CONSULTATION:  01/24/2012  REQUESTING PHYSICIAN:  Dr Ether Griffins CONSULTING PHYSICIAN:  Enid Baas, MD PRIMARY CARE PHYSICIAN: Cay Schillings, MD   REASON FOR CONSULTATION: Management of hypertension.    BRIEF HISTORY: [name] is a 64 year old Caucasian female with past medical history significant for multiple back surgeries with chronic back pain, severe peripheral neuropathy resulting in loss of sensation in the perirectal region with incontinence and also in both her legs. She has a chronic Charcot joint of her right foot that was resulting in neuropathy pain and also unstable joint so she was admitted by Dr. Ether Griffins for arthrodesis of her right ankle which was done today. The patient says she has history of hypertension and has been on atenolol 100 mg every day but lately her blood pressures have been up and down in the last few weeks so she was seen by Dr. Cay Schillings and was started on losartan 50 mg p.o. daily. Because of her new medication, patient had started taking her medication twice a day. She would have half her atenolol to 50 mg and take that with 50 mg of losartan in the morning and has been taking 50 of atenolol along with 50 mg of losartan in the evening. She had recorded her blood pressure at least 5 to 6 times every day at home and looking at that her pressure has been mostly in 130/80 range.  There were a couple of instances when the pressure went up to 180 systolic, mostly in the evening time; and patient says that is when her pain is worse related to neuropathy. Other than that she does not have any headache, chest pain, or other complaints related to hypertension.   PAST MEDICAL HISTORY:  1. Peripheral neuropathy.   2. Chronic low back pain.  3. Heart murmur.   4. Hypertension.  5. right ankle Charcot type arthritis.   PAST SURGICAL HISTORY:  1. Right hip surgery, right ankle, arthrodesis surgery,  back surgery x6.  2. Cholecystectomy.     ALLERGIES TO MEDICATIONS: Dilaudid, benzoin.    HOME MEDICATIONS:    1. Neurontin 300 mg p.o. b.i.d.  2. Levothyroxine 50 mcg p.o. daily.  3. Macrobid 100 mg p.o. daily.  4. Multivitamin 1 tablet p.o. daily.  5. Atenolol 50 mg p.o. b.i.d.  6. Losartan 150 mg p.o. b.i.d.   SOCIAL HISTORY: Lives at home with husband. No smoking or alcohol.   FAMILY HISTORY: Mother with glaucoma, hypertension. Grandmother with CVA and father died from heart disease in his 55s.   REVIEW OF SYSTEMS: CONSTITUTIONAL: No fever, fatigue, or weakness. EYES: No blurred vision, double vision, pain, redness, inflammation, or glaucoma. ENT: No tinnitus, ear pain, hearing loss, epistaxis or discharge. RESPIRATORY: No cough, wheeze, hemoptysis, or chronic obstructive pulmonary disease. CARDIOVASCULAR: No chest pain, orthopnea, edema, arrhythmia, palpitations, or syncope. GI: No nausea, vomiting, diarrhea, abdominal pain, hematemesis. Positive for stool incontinence. GENITOURINARY: No dysuria, hematuria. Positive for urinary incontinence. ENDOCRINE: No polyuria, nocturia. Positive for hypothyroidism. No heat or cold intolerance. HEMATOLOGY: No anemia, easy bruising or bleeding. SKIN: No acne, rash, or lesions. MUSCULOSKELETAL: Chronic low back pain and also neuropathy, shooting kind of pain in both legs. NEUROLOGIC: No CVA, transient ischemic attack, or seizures. PSYCHOLOGIC: Positive from some anxiety. No insomnia or depression.   PHYSICAL EXAMINATION:  VITAL SIGNS: Temperature is 98.2 degrees Fahrenheit, pulse 91, respirations 20, blood pressure 124/73, pulse oximetry 96% on  room air.   GENERAL: Well-built, well-nourished female lying in bed, not in any acute distress.   HEENT: Normocephalic, atraumatic. Pupils equal, round, reacting to light. Anicteric sclerae. Extraocular movements intact. Oropharynx clear without erythema, mass or exudates.   NECK: Supple. No thyromegaly, JVD,  or carotid bruits. No lymphadenopathy.   LUNGS: Clear to auscultation bilaterally. No wheeze or crackles. No use of accessory muscles for breathing.   CARDIOVASCULAR: S1, S2 regular rate and rhythm. Positive for 3/6 systolic murmur. No rubs or gallops.   ABDOMEN: Soft, nontender, nondistended. No hepatosplenomegaly. Normal bowel sounds.   EXTREMITIES: Right foot is postsurgical, wrapped up and elevated on one pillow. Left foot, pedal, dorsalis pedis pulses palpable on the left side, no pedal edema. No clubbing or cyanosis.   SKIN: No acne, rash, or lesions.   LYMPHATICS: No cervical or inguinal lymphadenopathy.   NEUROLOGIC: Cranial nerves intact. No focal motor deficits, but decreased sensation all the way up to her pubic region. Also decreased perirectal sensation according to patient.   PSYCHOLOGICAL: She is awake, alert, oriented x3.    LABORATORY DATA: WBC 16.4, hemoglobin 11.4, hematocrit 35.1, platelet count 291,000.  Sodium 144, potassium 3.9, chloride 107, bicarbonate 28, BUN 13, creatinine 1.03, glucose of 163, calcium 8.7.   RECOMMENDATIONS: This is a 64 year old female with history of hypertension, severe peripheral neuropathy, was admitted for right ankle arthrodesis secondary to Charcot type of arthritis. Medical consult requested for hypertension management.  1. Hypertension. The patient was started on losartan recently by her PCP as mentioned above.  Blood pressure very well controlled on the regimen that she is taking at home based on her home blood pressure measurement readings so agreed to continue the same medication, atenolol and losartan 50 mg b.i.d. each and continue to monitor blood pressure at home. Low sodium diet advised and recommended followup with PCP. Basic metabolic panel checked for electrolytes and creatinine which are stable.  2. Arthrodesis of right ankle joint, management per Podiatry.  Pain management/physical therapy consult.  3. Leukocytosis, could be  stress reaction from the recent surgery. Recheck in a.m. If still elevated might start some antibiotics.  4. Chronic low back pain on pain medication. Continue gabapentin and other pain medications as recommended.     CODE STATUS: FULL CODE.   TIME SPENT ON CONSULTATION: 50 minutes.     ____________________________ Enid Baasadhika Laurrie Toppin, MD rk:vtd D: 01/24/2012 21:28:50 ET T: 01/25/2012 07:54:46 ET JOB#: [id number]  cc: Enid Baasadhika Khamia Stambaugh, MD, <Dictator> Mickie HillierJack H. Sheppard PentonWolf, MD Argentina DonovanJustin A. Ether GriffinsFowler, DPM Enid BaasADHIKA Margret Moat MD ELECTRONICALLY SIGNED 01/27/2012 13:52

## 2015-03-19 NOTE — Op Note (Signed)
PATIENT NAME:  [name], Merilyn F MR#:  [id number] DATE OF BIRTH:  1951-07-21  DATE OF PROCEDURE:  01/03/2012  PREOPERATIVE DIAGNOSIS: Avascular necrosis right talus with distal fibular ulceration.   POSTOPERATIVE DIAGNOSIS: Avascular necrosis right talus with distal fibular ulceration.   PROCEDURE: Bone biopsy of talus and fibula with culture of bone from talus, fibula, and ankle fluid.   SURGEON: Kalep Full A. Ether GriffinsFowler, DPM  ANESTHESIA: MAC with local.   HEMOSTASIS: Lidocaine with epinephrine infiltrated along incision sites.   OPERATIVE INDICATIONS: This is a 64 year old female who has undergone Charcot changes of her right ankle. She has noted avascular necrosis of her talus with noted comminution of the body of the talus. Recent white blood cell labeled bone scan was somewhat concerning for possible infection inside the ankle joint and discussion for bone biopsy has been undertaken and the patient has consented for biopsy of the bone of the talus and the fibula. All risks, benefits, alternatives, and complications associated with the surgery were discussed with the patient in full and consent has been given.   OPERATIVE PROCEDURE: The patient was brought into the operating room and placed on the operating table in the supine position. IV sedation was administered by the anesthesia team. The right anterior medial and lateral aspects of the ankle joint were infiltrated with Marcaine and lidocaine with epinephrine. After sterile prep and drape, attention was directed to the anterior medial aspect of the ankle joint where a full-thickness incision was made down to the ankle joint. A large amount of fibrotic and scar tissue was noted in this area. At this time, the ankle joint was opened. There was noted some viscous joint fluid. It did not have an infectious appearance, but a wound culture swab was taken of the fluid initially. At this time, there was noted to be  comminution of the body of the talus with  small areas of bone and cartilage within the ankle joint. Bone from the talus was then removed with a rongeur and sent for pathological examination as well as bone culture sterilely. The area was then irrigated with fluid and closed in layers with 3-0 and 4-0 Vicryl for the deep layers and 3-0 nylon for the skin. Attention was then directed to the fibula where a longitudinal incision was made along the distal aspect of the fibula at the site of the ulcerative site. Full thickness dissection was taken down to the bone. At this time, a trephine was used to infiltrate the lateral fibula and two small pieces of bone were removed. One was sent for wound culture and a second sent for pathological examination. This wound was then flushed with copious amounts of irrigation. Layered skin closure was performed with 3-0 and 4-0 Vicryl for the deeper and subcutaneous tissues and  4-0 nylon for the skin. She was placed in a well compressive sterile dressing with a posterior splint for stability. She will continue with nonweightbearing to her ankle and we will see her in the outpatient clinic in 5 to 7 days.   ____________________________ Argentina DonovanJustin A. Ether GriffinsFowler, DPM jaf:bjt D: 01/03/2012 09:02:31 ET T: 01/03/2012 10:19:52 ET JOB#: [id number]  cc: Jill AlexandersJustin A. Ether GriffinsFowler, DPM, <Dictator> Sherrika Weakland DPM ELECTRONICALLY SIGNED 01/23/2012 8:35

## 2015-05-01 ENCOUNTER — Telehealth: Payer: Self-pay | Admitting: Urology

## 2015-05-01 DIAGNOSIS — N390 Urinary tract infection, site not specified: Secondary | ICD-10-CM

## 2015-05-01 NOTE — Telephone Encounter (Signed)
Patient called.  She has taken all of the Bactrim that Earlie LouLindsay Overton, NP prescribed for her.  She is requesting another prescription for Bactrim.  She is not currently having symptoms, but would like to have a prescription sent to her [employer] [facility].  Please advise.

## 2015-05-01 NOTE — Telephone Encounter (Signed)
Okay to refill the Bactrim, but patient will need an office visit in August.

## 2015-05-02 MED ORDER — SULFAMETHOXAZOLE-TRIMETHOPRIM 800-160 MG PO TABS: 1.0000 | ORAL_TABLET | Freq: Two times a day (BID) | ORAL | Status: DC

## 2015-05-02 NOTE — Telephone Encounter (Signed)
LMOM that we will refill the Bactrim but patient needs a office visit in August. Reminded her to call and schedule that appointment.

## 2015-06-27 ENCOUNTER — Ambulatory Visit: Payer: Self-pay | Admitting: Family Medicine

## 2015-07-07 ENCOUNTER — Telehealth: Payer: Self-pay | Admitting: Family Medicine

## 2015-07-07 NOTE — Telephone Encounter (Signed)
Routed note to Dr. Sherryll Burger

## 2015-07-07 NOTE — Telephone Encounter (Signed)
Patient does have pre-op appointment with you on 07-28-15 and is having her surgery on 08-02-15 then a post op on 09-07-15. Would like to know if there is anything else she need to do as far as referral appointments before her surgery.

## 2015-07-10 NOTE — Telephone Encounter (Signed)
Patient will contact front desk in the morning 07/11/2015 to confirm the status of her referrals.

## 2015-07-11 NOTE — Telephone Encounter (Signed)
Per Dr. Sherryll Burger patient will contact front desk on 07/11/2015 to confirm status of referral.

## 2015-07-20 ENCOUNTER — Other Ambulatory Visit: Payer: Medicare FFS

## 2015-07-27 ENCOUNTER — Encounter
Admission: RE | Admit: 2015-07-27 | Discharge: 2015-07-27 | Disposition: A | Discharge status: Home / Self Care | Payer: Commercial Managed Care - HMO | Source: Ambulatory Visit | Attending: Orthopedic Surgery | Admitting: Orthopedic Surgery

## 2015-07-27 DIAGNOSIS — I10 Essential (primary) hypertension: Secondary | ICD-10-CM | POA: Diagnosis not present

## 2015-07-27 DIAGNOSIS — Z01810 Encounter for preprocedural cardiovascular examination: Secondary | ICD-10-CM | POA: Diagnosis present

## 2015-07-27 LAB — BASIC METABOLIC PANEL
Anion gap: 10 (ref 5–15)
BUN: 15 mg/dL (ref 6–20)
CALCIUM: 9.5 mg/dL (ref 8.9–10.3)
CO2: 31 mmol/L (ref 22–32)
CREATININE: 1.05 mg/dL — AB (ref 0.44–1.00)
Chloride: 98 mmol/L — ABNORMAL LOW (ref 101–111)
GFR calc non Af Amer: 55 mL/min — ABNORMAL LOW (ref >60)
GLUCOSE: 92 mg/dL (ref 65–99)
Potassium: 3.3 mmol/L — ABNORMAL LOW (ref 3.5–5.1)
Sodium: 139 mmol/L (ref 135–145)

## 2015-07-27 LAB — URINALYSIS COMPLETE WITH MICROSCOPIC (ARMC ONLY)
Bilirubin Urine: NEGATIVE
Glucose, UA: NEGATIVE mg/dL
Ketones, ur: NEGATIVE mg/dL
Nitrite: POSITIVE — AB
PH: 5 (ref 5.0–8.0)
Protein, ur: NEGATIVE mg/dL
Specific Gravity, Urine: 1.012 (ref 1.005–1.030)

## 2015-07-27 LAB — CBC
HCT: 39.3 % (ref 35.0–47.0)
Hemoglobin: 12.7 g/dL (ref 12.0–16.0)
MCH: 29.8 pg (ref 26.0–34.0)
MCHC: 32.2 g/dL (ref 32.0–36.0)
MCV: 92.5 fL (ref 80.0–100.0)
PLATELETS: 295 10*3/uL (ref 150–440)
RBC: 4.25 MIL/uL (ref 3.80–5.20)
RDW: 12.9 % (ref 11.5–14.5)
WBC: 6.2 10*3/uL (ref 3.6–11.0)

## 2015-07-27 LAB — TYPE AND SCREEN
ABO/RH(D): A POS
Antibody Screen: NEGATIVE

## 2015-07-27 LAB — APTT: aPTT: 31 seconds (ref 24–36)

## 2015-07-27 LAB — PROTIME-INR
INR: 0.99
Prothrombin Time: 13.3 seconds (ref 11.4–15.0)

## 2015-07-27 LAB — SEDIMENTATION RATE: Sed Rate: 36 mm/hr — ABNORMAL HIGH (ref 0–22)

## 2015-07-27 LAB — SURGICAL PCR SCREEN
MRSA, PCR: NEGATIVE
STAPHYLOCOCCUS AUREUS: NEGATIVE

## 2015-07-27 NOTE — OR Nursing (Signed)
Dr. Henrene Hawking notified with EKG report.  Requested clearance from Dr.Shan.  EKG faxed with clearance request.  Dr. Elenor Legato office notified that request was submitted

## 2015-07-27 NOTE — Patient Instructions (Addendum)
  Your procedure is scheduled on:08/01/15 Report to Day Surgery. To find out your arrival time please call [phone] between 1PM - 3PM on 08/02/15.  Remember: Instructions that are not followed completely may result in serious medical risk, up to and including death, or upon the discretion of your surgeon and anesthesiologist your surgery may need to be rescheduled.    __x__ 1. Do not eat food or drink liquids after midnight. No gum chewing or hard candies.     __x__ 2. No Alcohol for 24 hours before or after surgery.   __x__ 3. Bring all medications with you on the day of surgery if instructed.    ___x_ 4. Notify your doctor if there is any change in your medical condition     (cold, fever, infections).     Do not wear jewelry, make-up, hairpins, clips or nail polish.  Do not wear lotions, powders, or perfumes. You may wear deodorant.  Do not shave 48 hours prior to surgery. Men may shave face and neck.  Do not bring valuables to the hospital.    [facility] is not responsible for any belongings or valuables.               Contacts, dentures or bridgework may not be worn into surgery.  Leave your suitcase in the car. After surgery it may be brought to your room.  For patients admitted to the hospital, discharge time is determined by your                treatment team.   Patients discharged the day of surgery will not be allowed to drive home.   Please read over the following fact sheets that you were given:   Surgical Site Infection Prevention   ____ Take these medicines the morning of surgery with A SIP OF WATER:    1. atenolol  2.   3.   4.  5.  6.  ____ Fleet Enema (as directed)   __x__ Use CHG Soap as directed  ____ Use inhalers on the day of surgery  ____ Stop metformin 2 days prior to surgery    ____ Take 1/2 of usual insulin dose the night before surgery and none on the morning of surgery.   ____ Stop Coumadin/Plavix/aspirin on  ____ Stop  Anti-inflammatories on    ____ Stop supplements until after surgery.    ____ Bring C-Pap to the hospital.

## 2015-07-28 ENCOUNTER — Ambulatory Visit
Admission: RE | Admit: 2015-07-28 | Discharge: 2015-07-28 | Disposition: A | Discharge status: Home / Self Care | Payer: Commercial Managed Care - HMO | Source: Ambulatory Visit | Attending: Family Medicine | Admitting: Family Medicine

## 2015-07-28 ENCOUNTER — Inpatient Hospital Stay: Admission: RE | Admit: 2015-07-28 | Payer: Medicare FFS | Source: Ambulatory Visit

## 2015-07-28 ENCOUNTER — Ambulatory Visit (INDEPENDENT_AMBULATORY_CARE_PROVIDER_SITE_OTHER): Payer: Commercial Managed Care - HMO | Admitting: Family Medicine

## 2015-07-28 ENCOUNTER — Other Ambulatory Visit: Payer: Self-pay | Admitting: Family Medicine

## 2015-07-28 ENCOUNTER — Encounter: Payer: Self-pay | Admitting: Family Medicine

## 2015-07-28 VITALS — BP 122/76 | HR 88 | Temp 98.7°F | Resp 16 | Ht 63.0 in | Wt 145.5 lb

## 2015-07-28 DIAGNOSIS — I10 Essential (primary) hypertension: Secondary | ICD-10-CM | POA: Diagnosis not present

## 2015-07-28 DIAGNOSIS — Z01810 Encounter for preprocedural cardiovascular examination: Secondary | ICD-10-CM | POA: Insufficient documentation

## 2015-07-28 DIAGNOSIS — T753XXA Motion sickness, initial encounter: Secondary | ICD-10-CM

## 2015-07-28 DIAGNOSIS — E876 Hypokalemia: Secondary | ICD-10-CM

## 2015-07-28 DIAGNOSIS — G629 Polyneuropathy, unspecified: Secondary | ICD-10-CM

## 2015-07-28 LAB — ABO/RH: ABO/RH(D): A POS

## 2015-07-28 MED ORDER — SCOPOLAMINE 1 MG/3DAYS TD PT72: 1.0000 | MEDICATED_PATCH | TRANSDERMAL | Status: DC

## 2015-07-28 MED ORDER — TRIAMTERENE-HCTZ 37.5-25 MG PO CAPS: 1.0000 | ORAL_CAPSULE | Freq: Every morning | ORAL | Status: DC

## 2015-07-28 MED ORDER — GABAPENTIN 300 MG PO CAPS: 600.0000 mg | ORAL_CAPSULE | Freq: Two times a day (BID) | ORAL | Status: DC

## 2015-07-28 MED ORDER — ATENOLOL 25 MG PO TABS: 25.0000 mg | ORAL_TABLET | Freq: Every morning | ORAL | Status: DC

## 2015-07-28 MED ORDER — POTASSIUM CHLORIDE ER 10 MEQ PO TBCR: 10.0000 meq | EXTENDED_RELEASE_TABLET | Freq: Every day | ORAL | Status: DC

## 2015-07-28 NOTE — Progress Notes (Signed)
Name: [name]   MRN: [id number]    DOB: 1951-04-11   Date:07/28/2015       Progress Note  Subjective  Chief Complaint  Chief Complaint  Patient presents with  . Pre-op Exam    pt having hip replacement surgery 08/02/15 left hip    Hypertension This is a chronic problem. The problem is controlled. Pertinent negatives include no chest pain, headaches, palpitations or shortness of breath. Past treatments include beta blockers and diuretics. There is no history of kidney disease.   Pt. Is also requesting Pre-operative clearance for a left Hip replacement scheduled for September 7th, 2016. She had bloodwork and EKG done at the [facility] pre-op area yesterday. Patient has no adverse reactions to anesthesia in the past, is not prone to bleeding, and has no concerning cardiopulmonary symptoms at present.  Past Medical History  Diagnosis Date  . Scoliosis   . Heart murmur     for years, not heard now  . Hypertension   . Headache(784.0)     non post menopausal  . Neuropathic bladder   . UTI (lower urinary tract infection)     Dut to neuropathic bladder  . History of blood transfusion     post back surgeries- '90's  . Complication of anesthesia   . PONV (postoperative nausea and vomiting)     Past Surgical History  Procedure Laterality Date  . Back surgery    . Anterior release vertebral body w/ posterior fusion  1991    2 surgeries   . Tonsillectomy  1973  . Seportinoplasty  1980  . Cholecystectomy  1991  . Anterior and posterior spinal fusion  1995    L5- S  . Dilation and curettage of uterus  2004  . Hysteroscopy  2004  . Elbow wedge osteotomy  2005    L4  . Hardware replaced  2005  . Hardware removal  2006    T5  . P/p fusion Right 2010    2nd, 3rd, 4th  . Orif metatarsal fracture Right 2011    1 and 2  . Tendon release Right 2011  . Ankle fusion Right 2013    tendon release right 2nd toe  . Bone biospy Right     ankle  . Joint replacement Right 2008    hip  .  Revision total hip arthroplasty Right 2011  . Amputation Right 01/13/2014    Procedure: AMPUTATION BELOW RIGHT KNEE;  Surgeon: Wylene Simmer, MD;  Location: [place];  Service: Orthopedics;  Laterality: Right;  . Breast surgery Left 1990    biospy non cancer    No family history on file.  Social History   Social History  . Marital Status: Married    Spouse Name: N/A  . Number of Children: N/A  . Years of Education: N/A   Occupational History  . Not on file.   Social History Main Topics  . Smoking status: Never Smoker   . Smokeless tobacco: Never Used  . Alcohol Use: No  . Drug Use: No  . Sexual Activity: Yes   Other Topics Concern  . Not on file   Social History Narrative     Current outpatient prescriptions:  .  atenolol (TENORMIN) 25 MG tablet, Take by mouth every morning., Disp: , Rfl:  .  Cranberry 1000 MG CAPS, Take 3,600 mg by mouth daily., Disp: , Rfl:  .  diphenhydramine-acetaminophen (TYLENOL PM) 25-500 MG TABS, Take 2 tablets by mouth at bedtime., Disp: , Rfl:  .  ferrous fumarate (HEMOCYTE - 106 MG FE) 325 (106 FE) MG TABS tablet, Take 1 tablet by mouth., Disp: , Rfl:  .  gabapentin (NEURONTIN) 300 MG capsule, Take 300 mg by mouth 2 (two) times daily. , Disp: , Rfl:  .  oxyCODONE-acetaminophen (PERCOCET/ROXICET) 5-325 MG per tablet, Take by mouth every 4 (four) hours as needed for severe pain (takes rarely)., Disp: , Rfl:  .  senna (SENOKOT) 8.6 MG TABS tablet, Take 2 tablets by mouth daily., Disp: , Rfl:  .  triamterene-hydrochlorothiazide (DYAZIDE) 37.5-25 MG per capsule, Take 1 capsule by mouth every morning., Disp: , Rfl:   Allergies  Allergen Reactions  . Dilaudid [Hydromorphone] Shortness Of Breath  . Benzoin Rash  . Other Rash    "Old kind of Adhesive Tape".     Review of Systems  Constitutional: Negative for fever and chills.  Respiratory: Negative for shortness of breath.   Cardiovascular: Negative for chest pain and palpitations.  Neurological:  Negative for headaches.   Objective  Filed Vitals:   07/28/15 1154  BP: 122/76  Pulse: 88  Temp: 98.7 F (37.1 C)  Resp: 16  Height: 5' 3"  (1.6 m)  Weight: 145 lb 8 oz (65.998 kg)  SpO2: 97%    Physical Exam  Constitutional: She is oriented to person, place, and time and well-developed, well-nourished, and in no distress.  Cardiovascular: Normal rate and regular rhythm.   Murmur heard. Pulmonary/Chest: Effort normal and breath sounds normal.  Musculoskeletal: She exhibits no edema.  Neurological: She is alert and oriented to person, place, and time.  Skin: Skin is warm and dry.  Nursing note and vitals reviewed.   Recent Results (from the past 2160 hour(s))  Surgical pcr screen     Status: None   Collection Time: 07/27/15  3:30 PM  Result Value Ref Range   MRSA, PCR NEGATIVE NEGATIVE   Staphylococcus aureus NEGATIVE NEGATIVE    Comment:        The Xpert SA Assay (FDA approved for NASAL specimens in patients over 58 years of age), is one component of a comprehensive surveillance program.  Test performance has been validated by [facility] for patients greater than or equal to 38 year old. It is not intended to diagnose infection nor to guide or monitor treatment.   CBC     Status: None   Collection Time: 07/27/15  4:00 PM  Result Value Ref Range   WBC 6.2 3.6 - 11.0 K/uL   RBC 4.25 3.80 - 5.20 MIL/uL   Hemoglobin 12.7 12.0 - 16.0 g/dL   HCT 39.3 35.0 - 47.0 %   MCV 92.5 80.0 - 100.0 fL   MCH 29.8 26.0 - 34.0 pg   MCHC 32.2 32.0 - 36.0 g/dL   RDW 12.9 11.5 - 14.5 %   Platelets 295 150 - 440 K/uL  Sedimentation rate     Status: Abnormal   Collection Time: 07/27/15  4:00 PM  Result Value Ref Range   Sed Rate 36 (H) 0 - 22 mm/hr    Comment: CORRECTED ON 09/01 AT 2042: PREVIOUSLY REPORTED AS 2  Basic metabolic panel     Status: Abnormal   Collection Time: 07/27/15  4:00 PM  Result Value Ref Range   Sodium 139 135 - 145 mmol/L   Potassium 3.3 (L) 3.5 - 5.1  mmol/L   Chloride 98 (L) 101 - 111 mmol/L   CO2 31 22 - 32 mmol/L   Glucose, Bld 92 65 - 99 mg/dL  BUN 15 6 - 20 mg/dL   Creatinine, Ser 1.05 (H) 0.44 - 1.00 mg/dL   Calcium 9.5 8.9 - 10.3 mg/dL   GFR calc non Af Amer 55 (L) >60 mL/min   GFR calc Af Amer >60 >60 mL/min    Comment: (NOTE) The eGFR has been calculated using the CKD EPI equation. This calculation has not been validated in all clinical situations. eGFR's persistently <60 mL/min signify possible Chronic Kidney Disease.    Anion gap 10 5 - 15  Protime-INR     Status: None   Collection Time: 07/27/15  4:00 PM  Result Value Ref Range   Prothrombin Time 13.3 11.4 - 15.0 seconds   INR 0.99   APTT     Status: None   Collection Time: 07/27/15  4:00 PM  Result Value Ref Range   aPTT 31 24 - 36 seconds  Urinalysis complete, with microscopic ([facility] only)     Status: Abnormal   Collection Time: 07/27/15  4:00 PM  Result Value Ref Range   Color, Urine YELLOW (A) YELLOW   APPearance HAZY (A) CLEAR   Glucose, UA NEGATIVE NEGATIVE mg/dL   Bilirubin Urine NEGATIVE NEGATIVE   Ketones, ur NEGATIVE NEGATIVE mg/dL   Specific Gravity, Urine 1.012 1.005 - 1.030   Hgb urine dipstick 1+ (A) NEGATIVE   pH 5.0 5.0 - 8.0   Protein, ur NEGATIVE NEGATIVE mg/dL   Nitrite POSITIVE (A) NEGATIVE   Leukocytes, UA 3+ (A) NEGATIVE   RBC / HPF 6-30 0 - 5 RBC/hpf   WBC, UA TOO NUMEROUS TO COUNT 0 - 5 WBC/hpf   Bacteria, UA RARE (A) NONE SEEN   Squamous Epithelial / LPF 6-30 (A) NONE SEEN   Hyaline Casts, UA PRESENT   Urine culture     Status: None (Preliminary result)   Collection Time: 07/27/15  4:00 PM  Result Value Ref Range   Specimen Description URINE, CLEAN CATCH    Special Requests none Normal    Culture      >=100,000 COLONIES/mL GRAM NEGATIVE RODS IDENTIFICATION AND SUSCEPTIBILITIES TO FOLLOW    Report Status PENDING   Type and screen for Red Blood Exchange     Status: None   Collection Time: 07/27/15  4:00 PM  Result Value Ref  Range   ABO/RH(D) A POS    Antibody Screen NEG    Sample Expiration 08/10/2015   ABO/Rh     Status: None   Collection Time: 07/27/15  4:01 PM  Result Value Ref Range   ABO/RH(D) A POS    Assessment & Plan  1. Encounter for pre-operative cardiovascular clearance Referral to cardiology for cardiac clearance.  - Ambulatory referral to Cardiology - DG Chest 2 View; Future  2. Essential hypertension  - atenolol (TENORMIN) 25 MG tablet; Take 1 tablet (25 mg total) by mouth every morning.  Dispense: 90 tablet; Refill: 0 - triamterene-hydrochlorothiazide (DYAZIDE) 37.5-25 MG per capsule; Take 1 each (1 capsule total) by mouth every morning.  Dispense: 90 capsule; Refill: 0  3. Sea sickness, initial encounter Transdermal scopolamine patches to be used in case of seasickness during an upcoming cruise to the [place].  - scopolamine (TRANSDERM-SCOP, 1.5 MG,) 1 MG/3DAYS; Place 1 patch (1.5 mg total) onto the skin every 3 (three) days.  Dispense: 10 patch; Refill: 0  4. Peripheral neuropathy  - gabapentin (NEURONTIN) 300 MG capsule; Take 2 capsules (600 mg total) by mouth 2 (two) times daily.  Dispense: 120 capsule; Refill: 2  [name] [name] 07/28/2015 12:03 PM

## 2015-07-28 NOTE — OR Nursing (Signed)
Abnormal lab results faxed to Dr. Hooten oErnest Pinece left message on nurses Consuella Lose) voicemail.

## 2015-07-29 LAB — URINE CULTURE: SPECIAL REQUESTS: NORMAL

## 2015-08-01 NOTE — OR Nursing (Signed)
Seen by Dr Juliann Pares this am and echo for 9/116/16.

## 2015-08-02 ENCOUNTER — Inpatient Hospital Stay: Payer: Commercial Managed Care - HMO | Admitting: Anesthesiology

## 2015-08-02 ENCOUNTER — Inpatient Hospital Stay
Admission: RE | Admit: 2015-08-02 | Discharge: 2015-08-05 | DRG: 470 | Disposition: A | Discharge status: Home Health | Payer: Commercial Managed Care - HMO | Source: Ambulatory Visit | Attending: Orthopedic Surgery | Admitting: Orthopedic Surgery

## 2015-08-02 ENCOUNTER — Encounter: Payer: Self-pay | Admitting: Anesthesiology

## 2015-08-02 ENCOUNTER — Inpatient Hospital Stay: Payer: Commercial Managed Care - HMO

## 2015-08-02 ENCOUNTER — Encounter
Admission: RE | Disposition: A | Discharge status: Home Health | Payer: Self-pay | Source: Ambulatory Visit | Attending: Orthopedic Surgery

## 2015-08-02 DIAGNOSIS — M1612 Unilateral primary osteoarthritis, left hip: Secondary | ICD-10-CM | POA: Diagnosis present

## 2015-08-02 DIAGNOSIS — M419 Scoliosis, unspecified: Secondary | ICD-10-CM | POA: Diagnosis present

## 2015-08-02 DIAGNOSIS — M810 Age-related osteoporosis without current pathological fracture: Secondary | ICD-10-CM | POA: Diagnosis present

## 2015-08-02 DIAGNOSIS — I10 Essential (primary) hypertension: Secondary | ICD-10-CM | POA: Diagnosis present

## 2015-08-02 DIAGNOSIS — N319 Neuromuscular dysfunction of bladder, unspecified: Secondary | ICD-10-CM | POA: Diagnosis present

## 2015-08-02 DIAGNOSIS — G629 Polyneuropathy, unspecified: Secondary | ICD-10-CM | POA: Diagnosis present

## 2015-08-02 DIAGNOSIS — M14671 Charcot's joint, right ankle and foot: Secondary | ICD-10-CM | POA: Diagnosis present

## 2015-08-02 DIAGNOSIS — E039 Hypothyroidism, unspecified: Secondary | ICD-10-CM | POA: Diagnosis present

## 2015-08-02 DIAGNOSIS — R011 Cardiac murmur, unspecified: Secondary | ICD-10-CM | POA: Diagnosis present

## 2015-08-02 DIAGNOSIS — Z96649 Presence of unspecified artificial hip joint: Secondary | ICD-10-CM

## 2015-08-02 DIAGNOSIS — G5791 Unspecified mononeuropathy of right lower limb: Secondary | ICD-10-CM | POA: Diagnosis present

## 2015-08-02 DIAGNOSIS — R32 Unspecified urinary incontinence: Secondary | ICD-10-CM | POA: Diagnosis present

## 2015-08-02 HISTORY — PX: TOTAL HIP ARTHROPLASTY: SHX124

## 2015-08-02 LAB — POTASSIUM: POTASSIUM: 3.4 mmol/L — AB (ref 3.5–5.1)

## 2015-08-02 SURGERY — ARTHROPLASTY, HIP, TOTAL,POSTERIOR APPROACH
Anesthesia: General | Laterality: Left

## 2015-08-02 MED ORDER — ONDANSETRON HCL 4 MG PO TABS: 4.0000 mg | ORAL_TABLET | Freq: Four times a day (QID) | ORAL | Status: DC | PRN

## 2015-08-02 MED ORDER — ACETAMINOPHEN 650 MG RE SUPP: 650.0000 mg | Freq: Four times a day (QID) | RECTAL | Status: DC | PRN

## 2015-08-02 MED ORDER — MIDAZOLAM HCL 2 MG/2ML IJ SOLN
INTRAMUSCULAR | Status: DC | PRN
  Administered 2015-08-02 (×2): 0.5 mg via INTRAVENOUS
  Administered 2015-08-02: 1 mg via INTRAVENOUS

## 2015-08-02 MED ORDER — ONDANSETRON HCL 4 MG/2ML IJ SOLN
4.0000 mg | Freq: Once | INTRAMUSCULAR | Status: AC | PRN
  Administered 2015-08-02: 4 mg via INTRAVENOUS

## 2015-08-02 MED ORDER — NEOMYCIN-POLYMYXIN B GU 40-200000 IR SOLN
Status: AC
  Filled 2015-08-02: qty 20

## 2015-08-02 MED ORDER — LACTATED RINGERS IV SOLN
INTRAVENOUS | Status: DC
  Administered 2015-08-02 (×3): via INTRAVENOUS

## 2015-08-02 MED ORDER — MAGNESIUM HYDROXIDE 400 MG/5ML PO SUSP
30.0000 mL | Freq: Every day | ORAL | Status: DC | PRN
  Administered 2015-08-04: 30 mL via ORAL
  Filled 2015-08-02: qty 30

## 2015-08-02 MED ORDER — MENTHOL 3 MG MT LOZG: 1.0000 | LOZENGE | OROMUCOSAL | Status: DC | PRN

## 2015-08-02 MED ORDER — TRAMADOL HCL 50 MG PO TABS: 50.0000 mg | ORAL_TABLET | ORAL | Status: DC | PRN

## 2015-08-02 MED ORDER — POTASSIUM CHLORIDE ER 10 MEQ PO TBCR
10.0000 meq | EXTENDED_RELEASE_TABLET | Freq: Every day | ORAL | Status: DC
  Administered 2015-08-02 – 2015-08-04 (×3): 10 meq via ORAL
  Filled 2015-08-02 (×8): qty 1

## 2015-08-02 MED ORDER — GABAPENTIN 300 MG PO CAPS
600.0000 mg | ORAL_CAPSULE | Freq: Two times a day (BID) | ORAL | Status: DC
  Administered 2015-08-02 – 2015-08-04 (×5): 600 mg via ORAL
  Filled 2015-08-02 (×7): qty 2

## 2015-08-02 MED ORDER — METOCLOPRAMIDE HCL 10 MG PO TABS
10.0000 mg | ORAL_TABLET | Freq: Three times a day (TID) | ORAL | Status: AC
  Administered 2015-08-02 – 2015-08-04 (×6): 10 mg via ORAL
  Filled 2015-08-02 (×6): qty 1

## 2015-08-02 MED ORDER — ONDANSETRON HCL 4 MG/2ML IJ SOLN
INTRAMUSCULAR | Status: AC
  Administered 2015-08-02: 4 mg via INTRAVENOUS
  Filled 2015-08-02: qty 2

## 2015-08-02 MED ORDER — FENTANYL CITRATE (PF) 100 MCG/2ML IJ SOLN
25.0000 ug | INTRAMUSCULAR | Status: AC | PRN
  Administered 2015-08-02 (×6): 25 ug via INTRAVENOUS

## 2015-08-02 MED ORDER — NEOSTIGMINE METHYLSULFATE 10 MG/10ML IV SOLN
INTRAVENOUS | Status: DC | PRN
  Administered 2015-08-02: 3 mg via INTRAVENOUS

## 2015-08-02 MED ORDER — SCOPOLAMINE 1 MG/3DAYS TD PT72
1.0000 | MEDICATED_PATCH | TRANSDERMAL | Status: DC
  Filled 2015-08-02: qty 1

## 2015-08-02 MED ORDER — ACETAMINOPHEN 325 MG PO TABS
650.0000 mg | ORAL_TABLET | Freq: Four times a day (QID) | ORAL | Status: DC | PRN
Start: 2015-08-02 — End: 2015-08-05
  Administered 2015-08-05: 650 mg via ORAL
  Filled 2015-08-02: qty 2

## 2015-08-02 MED ORDER — ENOXAPARIN SODIUM 30 MG/0.3ML ~~LOC~~ SOLN
30.0000 mg | Freq: Two times a day (BID) | SUBCUTANEOUS | Status: DC
  Administered 2015-08-03 – 2015-08-05 (×5): 30 mg via SUBCUTANEOUS
  Filled 2015-08-02 (×6): qty 0.3

## 2015-08-02 MED ORDER — TRIAMTERENE-HCTZ 37.5-25 MG PO CAPS
1.0000 | ORAL_CAPSULE | Freq: Every morning | ORAL | Status: DC
  Administered 2015-08-02: 1 via ORAL
  Filled 2015-08-02 (×7): qty 1

## 2015-08-02 MED ORDER — ACETAMINOPHEN 10 MG/ML IV SOLN
INTRAVENOUS | Status: DC | PRN
  Administered 2015-08-02: 1000 mg via INTRAVENOUS

## 2015-08-02 MED ORDER — ROCURONIUM BROMIDE 100 MG/10ML IV SOLN
INTRAVENOUS | Status: DC | PRN
  Administered 2015-08-02: 10 mg via INTRAVENOUS
  Administered 2015-08-02: 40 mg via INTRAVENOUS
  Administered 2015-08-02: 10 mg via INTRAVENOUS

## 2015-08-02 MED ORDER — FAMOTIDINE 20 MG PO TABS
20.0000 mg | ORAL_TABLET | Freq: Once | ORAL | Status: AC
  Administered 2015-08-02: 20 mg via ORAL

## 2015-08-02 MED ORDER — CEFAZOLIN SODIUM-DEXTROSE 2-3 GM-% IV SOLR
2.0000 g | Freq: Once | INTRAVENOUS | Status: AC
  Administered 2015-08-02: 2 g via INTRAVENOUS

## 2015-08-02 MED ORDER — FAMOTIDINE 20 MG PO TABS
ORAL_TABLET | ORAL | Status: AC
  Filled 2015-08-02: qty 1

## 2015-08-02 MED ORDER — FLEET ENEMA 7-19 GM/118ML RE ENEM: 1.0000 | ENEMA | Freq: Once | RECTAL | Status: DC | PRN

## 2015-08-02 MED ORDER — LABETALOL HCL 5 MG/ML IV SOLN
INTRAVENOUS | Status: DC | PRN
  Administered 2015-08-02: 5 mg via INTRAVENOUS

## 2015-08-02 MED ORDER — GLYCOPYRROLATE 0.2 MG/ML IJ SOLN
INTRAMUSCULAR | Status: DC | PRN
  Administered 2015-08-02: 0.6 mg via INTRAVENOUS

## 2015-08-02 MED ORDER — ONDANSETRON HCL 4 MG/2ML IJ SOLN
INTRAMUSCULAR | Status: DC | PRN
  Administered 2015-08-02: 4 mg via INTRAVENOUS

## 2015-08-02 MED ORDER — MORPHINE SULFATE (PF) 2 MG/ML IV SOLN: 1.0000 mg | INTRAVENOUS | Status: DC | PRN

## 2015-08-02 MED ORDER — CEFAZOLIN SODIUM-DEXTROSE 2-3 GM-% IV SOLR
INTRAVENOUS | Status: AC
  Filled 2015-08-02: qty 50

## 2015-08-02 MED ORDER — ONDANSETRON HCL 4 MG/2ML IJ SOLN: 4.0000 mg | Freq: Four times a day (QID) | INTRAMUSCULAR | Status: DC | PRN

## 2015-08-02 MED ORDER — PHENOL 1.4 % MT LIQD: 1.0000 | OROMUCOSAL | Status: DC | PRN

## 2015-08-02 MED ORDER — BISACODYL 10 MG RE SUPP
10.0000 mg | Freq: Every day | RECTAL | Status: DC | PRN
Start: 2015-08-02 — End: 2015-08-05
  Administered 2015-08-04: 10 mg via RECTAL
  Filled 2015-08-02: qty 1

## 2015-08-02 MED ORDER — SENNOSIDES-DOCUSATE SODIUM 8.6-50 MG PO TABS
1.0000 | ORAL_TABLET | Freq: Two times a day (BID) | ORAL | Status: DC
  Administered 2015-08-02 – 2015-08-04 (×6): 1 via ORAL
  Filled 2015-08-02 (×7): qty 1

## 2015-08-02 MED ORDER — MORPHINE SULFATE (PF) 2 MG/ML IV SOLN: 2.0000 mg | INTRAVENOUS | Status: DC | PRN

## 2015-08-02 MED ORDER — LIDOCAINE HCL (CARDIAC) 20 MG/ML IV SOLN
INTRAVENOUS | Status: DC | PRN
  Administered 2015-08-02: 30 mg via INTRAVENOUS

## 2015-08-02 MED ORDER — ACETAMINOPHEN 10 MG/ML IV SOLN
INTRAVENOUS | Status: AC
  Filled 2015-08-02: qty 100

## 2015-08-02 MED ORDER — PROPOFOL 10 MG/ML IV BOLUS
INTRAVENOUS | Status: DC | PRN
  Administered 2015-08-02: 150 mg via INTRAVENOUS

## 2015-08-02 MED ORDER — SODIUM CHLORIDE 0.9 % IV SOLN
INTRAVENOUS | Status: DC
  Administered 2015-08-02 – 2015-08-03 (×2): via INTRAVENOUS

## 2015-08-02 MED ORDER — FENTANYL CITRATE (PF) 100 MCG/2ML IJ SOLN
INTRAMUSCULAR | Status: DC | PRN
  Administered 2015-08-02: 50 ug via INTRAVENOUS
  Administered 2015-08-02: 25 ug via INTRAVENOUS
  Administered 2015-08-02: 50 ug via INTRAVENOUS
  Administered 2015-08-02: 25 ug via INTRAVENOUS
  Administered 2015-08-02: 50 ug via INTRAVENOUS

## 2015-08-02 MED ORDER — ACETAMINOPHEN 10 MG/ML IV SOLN
1000.0000 mg | Freq: Four times a day (QID) | INTRAVENOUS | Status: AC
  Administered 2015-08-02 – 2015-08-03 (×3): 1000 mg via INTRAVENOUS
  Filled 2015-08-02 (×4): qty 100

## 2015-08-02 MED ORDER — OXYCODONE HCL 5 MG PO TABS
5.0000 mg | ORAL_TABLET | ORAL | Status: DC | PRN
  Administered 2015-08-02 – 2015-08-05 (×12): 10 mg via ORAL
  Filled 2015-08-02 (×12): qty 2

## 2015-08-02 MED ORDER — FERROUS FUMARATE 325 (106 FE) MG PO TABS: 1.0000 | ORAL_TABLET | Freq: Two times a day (BID) | ORAL | Status: DC

## 2015-08-02 MED ORDER — FENTANYL CITRATE (PF) 100 MCG/2ML IJ SOLN
INTRAMUSCULAR | Status: AC
  Administered 2015-08-02: 25 ug via INTRAVENOUS
  Filled 2015-08-02: qty 2

## 2015-08-02 MED ORDER — FERROUS SULFATE 325 (65 FE) MG PO TABS
325.0000 mg | ORAL_TABLET | Freq: Two times a day (BID) | ORAL | Status: DC
  Administered 2015-08-02 – 2015-08-05 (×6): 325 mg via ORAL
  Filled 2015-08-02 (×7): qty 1

## 2015-08-02 MED ORDER — DIPHENHYDRAMINE HCL 12.5 MG/5ML PO ELIX: 12.5000 mg | ORAL_SOLUTION | ORAL | Status: DC | PRN

## 2015-08-02 MED ORDER — ATENOLOL 25 MG PO TABS
25.0000 mg | ORAL_TABLET | Freq: Every morning | ORAL | Status: DC
  Administered 2015-08-03 – 2015-08-04 (×2): 25 mg via ORAL
  Filled 2015-08-02 (×4): qty 1

## 2015-08-02 MED ORDER — CEFAZOLIN SODIUM-DEXTROSE 2-3 GM-% IV SOLR
2.0000 g | Freq: Four times a day (QID) | INTRAVENOUS | Status: AC
  Administered 2015-08-02 – 2015-08-03 (×4): 2 g via INTRAVENOUS
  Filled 2015-08-02 (×4): qty 50

## 2015-08-02 MED ORDER — TRANEXAMIC ACID 1000 MG/10ML IV SOLN
1000.0000 mg | INTRAVENOUS | Status: DC | PRN
  Administered 2015-08-02: 1000 mg via INTRAVENOUS

## 2015-08-02 MED ORDER — MORPHINE SULFATE (PF) 10 MG/ML IV SOLN
INTRAVENOUS | Status: AC
  Administered 2015-08-02 (×5): 2 mg via INTRAVENOUS
  Filled 2015-08-02: qty 1

## 2015-08-02 MED ORDER — ALUM & MAG HYDROXIDE-SIMETH 200-200-20 MG/5ML PO SUSP: 30.0000 mL | ORAL | Status: DC | PRN

## 2015-08-02 MED ORDER — TRANEXAMIC ACID 1000 MG/10ML IV SOLN
1000.0000 mg | INTRAVENOUS | Status: DC
  Filled 2015-08-02: qty 10

## 2015-08-02 MED ORDER — TRANEXAMIC ACID 1000 MG/10ML IV SOLN
1000.0000 mg | Freq: Once | INTRAVENOUS | Status: AC
  Administered 2015-08-02: 1000 mg via INTRAVENOUS
  Filled 2015-08-02: qty 10

## 2015-08-02 SURGICAL SUPPLY — 51 items
BLADE DRUM FLTD (BLADE) ×3 IMPLANT
BLADE SAW 1 (BLADE) ×3 IMPLANT
CANISTER SUCT 1200ML W/VALVE (MISCELLANEOUS) ×3 IMPLANT
CANISTER SUCT 3000ML (MISCELLANEOUS) ×6 IMPLANT
CAPT HIP TOTAL 2 ×3 IMPLANT
CARTRIDGE OIL MAESTRO DRILL (MISCELLANEOUS) ×1 IMPLANT
CATH FOL LEG HOLDER (MISCELLANEOUS) ×3 IMPLANT
CATH TRAY METER 16FR LF (MISCELLANEOUS) ×3 IMPLANT
DIFFUSER MAESTRO (MISCELLANEOUS) ×3 IMPLANT
DRAPE INCISE IOBAN 66X60 STRL (DRAPES) ×3 IMPLANT
DRAPE SHEET LG 3/4 BI-LAMINATE (DRAPES) ×3 IMPLANT
DRAPE TABLE BACK 80X90 (DRAPES) ×3 IMPLANT
DRSG DERMACEA 8X12 NADH (GAUZE/BANDAGES/DRESSINGS) ×3 IMPLANT
DRSG OPSITE POSTOP 3X4 (GAUZE/BANDAGES/DRESSINGS) ×3 IMPLANT
DRSG OPSITE POSTOP 4X12 (GAUZE/BANDAGES/DRESSINGS) ×3 IMPLANT
DRSG OPSITE POSTOP 4X14 (GAUZE/BANDAGES/DRESSINGS) ×3 IMPLANT
DRSG TEGADERM 4X4.75 (GAUZE/BANDAGES/DRESSINGS) ×3 IMPLANT
DURAPREP 26ML APPLICATOR (WOUND CARE) ×3 IMPLANT
ELECT BLADE 6.5 EXT (BLADE) ×3 IMPLANT
ELECT CAUTERY BLADE 6.4 (BLADE) ×3 IMPLANT
GLOVE BIOGEL M STRL SZ7.5 (GLOVE) ×3 IMPLANT
GLOVE INDICATOR 8.0 STRL GRN (GLOVE) ×3 IMPLANT
GLOVE SURG 9.0 ORTHO LTXF (GLOVE) ×3 IMPLANT
GLOVE SURG ORTHO 9.0 STRL STRW (GLOVE) ×3 IMPLANT
GOWN STRL REUS W/ TWL LRG LVL3 (GOWN DISPOSABLE) ×1 IMPLANT
GOWN STRL REUS W/TWL 2XL LVL3 (GOWN DISPOSABLE) ×3 IMPLANT
GOWN STRL REUS W/TWL LRG LVL3 (GOWN DISPOSABLE) ×2
GOWN STRL REUS W/TWL XL LVL4 (GOWN DISPOSABLE) ×3 IMPLANT
HANDPIECE SUCTION TUBG SURGILV (MISCELLANEOUS) ×3 IMPLANT
HEMOVAC 400CC 10FR (MISCELLANEOUS) ×3 IMPLANT
HOOD PEEL AWAY FACE SHEILD DIS (HOOD) ×6 IMPLANT
KIT RM TURNOVER STRD PROC AR (KITS) ×3 IMPLANT
NDL SAFETY 18GX1.5 (NEEDLE) ×3 IMPLANT
NS IRRIG 1000ML POUR BTL (IV SOLUTION) ×3 IMPLANT
OIL CARTRIDGE MAESTRO DRILL (MISCELLANEOUS) ×3
PACK HIP PROSTHESIS (MISCELLANEOUS) ×3 IMPLANT
SOL .9 NS 3000ML IRR  AL (IV SOLUTION) ×2
SOL .9 NS 3000ML IRR UROMATIC (IV SOLUTION) ×1 IMPLANT
SOL PREP PVP 2OZ (MISCELLANEOUS) ×3
SOLUTION PREP PVP 2OZ (MISCELLANEOUS) ×1 IMPLANT
SPONGE DRAIN TRACH 4X4 STRL 2S (GAUZE/BANDAGES/DRESSINGS) ×3 IMPLANT
STAPLER SKIN PROX 35W (STAPLE) ×3 IMPLANT
SUT ETHIBOND #5 BRAIDED 30INL (SUTURE) ×3 IMPLANT
SUT VIC AB 0 CT1 36 (SUTURE) ×3 IMPLANT
SUT VIC AB 1 CT1 36 (SUTURE) ×6 IMPLANT
SUT VIC AB 2-0 CT1 27 (SUTURE) ×2
SUT VIC AB 2-0 CT1 TAPERPNT 27 (SUTURE) ×1 IMPLANT
SYR 20CC LL (SYRINGE) ×3 IMPLANT
TAPE ADH 3 LX (MISCELLANEOUS) ×3 IMPLANT
TAPE TRANSPORE STRL 2 31045 (GAUZE/BANDAGES/DRESSINGS) ×3 IMPLANT
WATER STERILE IRR 1000ML POUR (IV SOLUTION) ×6 IMPLANT

## 2015-08-02 NOTE — Op Note (Signed)
OPERATIVE NOTE  DATE OF SURGERY:  08/02/2015  PATIENT NAME:  [name]   DOB: [birth year]  MRN: [id number]  PRE-OPERATIVE DIAGNOSIS: Degenerative arthrosis of the left hip, primary  POST-OPERATIVE DIAGNOSIS:  Same  PROCEDURE:  Left total hip arthroplasty  SURGEON:  Jena Gauss. M.D.  ASSISTANT:  Van Clines, PA (present and scrubbed throughout the case, critical for assistance with exposure, retraction, instrumentation, and closure)  ANESTHESIA: general  ESTIMATED BLOOD LOSS: 150 mL  FLUIDS REPLACED: 1000 mL of crystalloid  DRAINS: 2 medium drains to a Hemovac reservoir  IMPLANTS UTILIZED: DePuy 13.5 mm small stature AML femoral stem, 52 mm OD Pinnacle 100 acetabular component, +4 mm neutral Pinnacle Marathon polyethylene insert, and a 36 mm M-SPEC +1.5 mm hip ball  INDICATIONS FOR SURGERY: [name] is a 64 y.o. year old female with a long history of progressive hip and groin  pain. X-rays demonstrated severe degenerative changes. The patient had not seen any significant improvement despite conservative nonsurgical intervention. After discussion of the risks and benefits of surgical intervention, the patient expressed understanding of the risks benefits and agree with plans for total hip arthroplasty.   The risks, benefits, and alternatives were discussed at length including but not limited to the risks of infection, bleeding, nerve injury, stiffness, blood clots, the need for revision surgery, limb length inequality, dislocation, cardiopulmonary complications, among others, and they were willing to proceed.  PROCEDURE IN DETAIL: The patient was brought into the operating room and, after adequate general anesthesia was achieved, the patient was placed in a right lateral decubitus position. Axillary roll was placed and all bony prominences were well-padded. The patient's left hip was cleaned and prepped with alcohol and DuraPrep and draped in the usual sterile fashion. A  "timeout" was performed as per usual protocol. A lateral curvilinear incision was made gently curving towards the posterior superior iliac spine. The IT band was incised in line with the skin incision and the fibers of the gluteus maximus were split in line. The piriformis tendon was identified, skeletonized, and incised at its insertion to the proximal femur and reflected posteriorly. A T type posterior capsulotomy was performed. Prior to dislocation of the femoral head, a threaded Steinmann pin was inserted through a separate stab incision into the pelvis superior to the acetabulum and bent in the form of a stylus so as to assess limb length and hip offset throughout the procedure. The femoral head was then dislocated posteriorly. Inspection of the femoral head demonstrated severe degenerative changes with full-thickness loss of articular cartilage. The femoral neck cut was performed using an oscillating saw. The anterior capsule was elevated off of the femoral neck using a periosteal elevator. Attention was then directed to the acetabulum. The remnant of the labrum was excised using electrocautery. Inspection of the acetabulum also demonstrated significant degenerative changes. The acetabulum was reamed in sequential fashion up to a 51 mm diameter. Good punctate bleeding bone was encountered. A 52 mm Pinnacle 100 acetabular component was positioned and impacted into place. Good scratch fit was appreciated. A +4 mm polyethylene trial was inserted.  Attention was then directed to the proximal femur. A hole for reaming of the proximal femoral canal was created using a high-speed burr. The femoral canal was reamed in sequential fashion up to a 13 mm diameter. This allowed for approximately 6 mm of scratch fit. Serial broaches were inserted up to a 13.5 mm small stature femoral broach. Calcar region was planed and a trial  reduction was performed using a 36 mm hip ball with a +1.5 mm neck length. Good equalization of  limb lengths and hip offset was appreciated and excellent stability was noted both anteriorly and posteriorly. Trial components were removed. The acetabular shell was irrigated with copious amounts of normal saline with antibiotic solution and suctioned dry. A +4 mm Pinnacle Marathon polyethylene insert was positioned and impacted into place. Next, a 13.5 mm small stature AML femoral stem was positioned and impacted into place. Excellent scratch fit was appreciated. A trial reduction was again performed with a 36 mm hip ball with a +1.5 mm neck length. Again, good equalization of limb lengths was appreciated and excellent stability appreciated both anteriorly and posteriorly. The hip was then dislocated and the trial hip ball was removed. The Morse taper was cleaned and dried. A 36 mm M-SPEC hip ball with a +1.5 mm neck length was placed on the trunnion and impacted into place. The hip was then reduced and placed through range of motion. Excellent stability was appreciated both anteriorly and posteriorly.  The wound was irrigated with copious amounts of normal saline with antibiotic solution and suctioned dry. Good hemostasis was appreciated. The posterior capsulotomy was repaired using #5 Ethibond. Piriformis tendon was reapproximated to the undersurface of the gluteus medius tendon using #5 Ethibond. Two medium drains were placed in the wound bed and brought out through separate stab incisions to be attached to a Hemovac reservoir. The IT band was reapproximated using interrupted sutures of #1 Vicryl. Subcutaneous tissue was proximal phalanx using first #0 Vicryl followed by #2-0 Vicryl. The skin was closed with skin staples.  The patient tolerated the procedure well and was transported to the recovery room in stable condition.   Jena Gauss., M.D.

## 2015-08-02 NOTE — Evaluation (Signed)
Physical Therapy Evaluation Patient Details Name: [name] MRN: [id number] DOB: May 30, 1951 Today's Date: 08/02/2015   History of Present Illness  Pt is a 64 yo female who was admitted to the hospital s/p L THA performed 08/03/15. Additionally, pt currently ambulates with crutches and a hx of R BKA  Clinical Impression  Pt presents with hx of heart murmur, neuropathic bladder, UTI, and HTN. Examination reveals that pt performs bed mobility at mod assist, transfers at [facility] assist, and ambulation at min assist. Pt has pretty good functional strength in surrounding extremities, but was challenged today slightly by managing her prosthesis, her cords, and her crutches. It is anticipated that she will be able to perform mobility better tomorrow when she has a bit more energy. Pt has good support in her husband as well as rehabilitation process from orthopedic surgery. Due to her pain, ROM, and mobility deficits, she will continue to benefit from skilled PT in order for her to safely return home.    Follow Up Recommendations Home health PT    Equipment Recommendations  None recommended by PT    Recommendations for Other Services       Precautions / Restrictions Precautions Precautions: Fall Required Braces or Orthoses:  (Prosthetic on R side) Restrictions Weight Bearing Restrictions: Yes Other Position/Activity Restrictions: WBAT      Mobility  Bed Mobility Overal bed mobility: Needs Assistance Bed Mobility: Supine to Sit     Supine to sit: Mod assist     General bed mobility comments: Pt needs assist for LE management and trunk support. Good strength in UEs noted.   Transfers Overall transfer level: Needs assistance Equipment used: Crutches Transfers: Sit to/from Stand Sit to Stand: Mod assist;+2 safety/equipment;+2 physical assistance         General transfer comment: Pt required assist for standing and gaining balance. PT performed HH assist in order to get her cruthes  under her. Pt was relatively stable once she had the crutches underneath her armpits   Ambulation/Gait Ambulation/Gait assistance: Min assist Ambulation Distance (Feet): 2 Feet Assistive device: Crutches Gait Pattern/deviations:  (Fully assess tomorrow) Gait velocity: decreased   General Gait Details: Pt requires assist management of lines, and cues for direction and sequencing with her crutches  Stairs            Wheelchair Mobility    Modified Rankin (Stroke Patients Only)       Balance Overall balance assessment: No apparent balance deficits (not formally assessed)                                           Pertinent Vitals/Pain Pain Assessment: 0-10 Pain Score: 6  Pain Location: L hip  Pain Intervention(s): Ice applied;Limited activity within patient's tolerance;Monitored during session;Premedicated before session    Home Living Family/patient expects to be discharged to:: Private residence Living Arrangements: Spouse/significant other Available Help at Discharge: Available 24 hours/day Type of Home: House Home Access: Stairs to enter Entrance Stairs-Rails: Right Entrance Stairs-Number of Steps: 4 Home Layout: One level   Additional Comments: Pt has had similar surgery; house is set up for her success     Prior Function Level of Independence: Independent         Comments: Pt was mod I with crutches for ambulation (community distances)      Hand Dominance        Extremity/Trunk Assessment  Upper Extremity Assessment: Overall WFL for tasks assessed           Lower Extremity Assessment: LLE deficits/detail   LLE Deficits / Details: Gross MMT at least 3/5 for LLE     Communication   Communication: No difficulties  Cognition Arousal/Alertness: Awake/alert Behavior During Therapy: Restless;Impulsive;Anxious Overall Cognitive Status: Within Functional Limits for tasks assessed                      General  Comments      Exercises Other Exercises Other Exercises: Pt performed bilateral therex x 10 reps with supervision - min assist for facilitation of movement and proper technique. Exercises performed: ankle pumps, quad sets, glute sets, SLR (min assist), hip abd, and SAQ.       Assessment/Plan    PT Assessment Patient needs continued PT services  PT Diagnosis Difficulty walking;Abnormality of gait   PT Problem List Decreased strength;Decreased range of motion;Decreased activity tolerance;Decreased mobility;Decreased knowledge of use of DME;Pain  PT Treatment Interventions DME instruction;Gait training;Stair training;Functional mobility training;Therapeutic activities;Therapeutic exercise;Neuromuscular re-education;Balance training;Cognitive remediation;Patient/family education;Manual techniques   PT Goals (Current goals can be found in the Care Plan section) Acute Rehab PT Goals Patient Stated Goal: to return home PT Goal Formulation: With patient Time For Goal Achievement: 08/16/15 Potential to Achieve Goals: Good    Frequency BID   Barriers to discharge        Co-evaluation               End of Session Equipment Utilized During Treatment: Gait belt Activity Tolerance: Patient tolerated treatment well Patient left: in chair;with call bell/phone within reach;with bed alarm set;with nursing/sitter in room;with family/visitor present;with SCD's reapplied Nurse Communication: Mobility status         Time: 1610-1640 PT Time Calculation (min) (ACUTE ONLY): 30 min   Charges:         PT G CodesBenna Dunks 08/08/15, 5:31 PM  Benna Dunks, SPT. [phone]

## 2015-08-02 NOTE — Brief Op Note (Signed)
08/02/2015  10:30 AM  PATIENT:  [name]  64 y.o. female  PRE-OPERATIVE DIAGNOSIS:  DEGENERATIVE OSTEOARTHRITIS LEFT HIP  POST-OPERATIVE DIAGNOSIS:  same  PROCEDURE:  Procedure(s): TOTAL HIP ARTHROPLASTY (Left)  SURGEON:  Surgeon(s) and Role:    * Donato Heinz, MD - Primary  ASSISTANTS: Van Clines, PA    ANESTHESIA:   general  EBL:  Total I/O In: 1000 [I.V.:1000] Out: 250 [Urine:100; Blood:150]  BLOOD ADMINISTERED:none  DRAINS: 2 medium hemovac   LOCAL MEDICATIONS USED:  NONE  SPECIMEN:  Source of Specimen:  left femoral head  DISPOSITION OF SPECIMEN:  PATHOLOGY  COUNTS:  YES  TOURNIQUET:  * No tourniquets in log *  DICTATION: .Dragon Dictation  PLAN OF CARE: Admit to inpatient   PATIENT DISPOSITION:  PACU - hemodynamically stable.   Delay start of Pharmacological VTE agent (>24hrs) due to surgical blood loss or risk of bleeding: yes

## 2015-08-02 NOTE — Evaluation (Signed)
Physical Therapy Evaluation Patient Details Name: [name] MRN: [id number] DOB: 01/24/1951 Today's Date: 08/02/2015   History of Present Illness  Pt is a 64 yo female who was admitted to the hospital s/p L THA performed 08/03/15.  Clinical Impression   Pt presents with hx of heart murmur, neuropathic bladder, UTI, and HTN. Examination reveals that pt performs bed mobility at mod assist, transfers at [facility] assist, and ambulation at min assist. Pt has pretty good functional strength in surrounding extremities, but was challenged today slightly by managing her prosthesis, her cords, and her crutches. It is anticipated that she will be able to perform mobility better tomorrow when she has a bit more energy. Pt has good support in her husband as well as rehabilitation process from orthopedic surgery. Due to her pain, ROM, and mobility deficits, she will continue to benefit from skilled PT in order for her to safely return home.   Follow Up Recommendations Home health PT    Equipment Recommendations  None recommended by PT    Recommendations for Other Services       Precautions / Restrictions Precautions Precautions: Fall Required Braces or Orthoses:  (Prosthetic on R side) Restrictions Weight Bearing Restrictions: Yes      Mobility  Bed Mobility Overal bed mobility: Needs Assistance Bed Mobility: Supine to Sit     Supine to sit: Mod assist     General bed mobility comments: Pt needs assist for LE management and trunk support. Good strength in UEs noted.   Transfers Overall transfer level: Needs assistance Equipment used: Crutches Transfers: Sit to/from Stand Sit to Stand: Mod assist;+2 safety/equipment;+2 physical assistance         General transfer comment: Pt required assist for standing and gaining balance. PT performed HH assist in order to get her cruthes under her. Pt was relatively stable once she had the crutches underneath her armpits    Ambulation/Gait Ambulation/Gait assistance: Min assist Ambulation Distance (Feet): 2 Feet Assistive device: Crutches Gait Pattern/deviations:  (Fully assess tomorrow) Gait velocity: decreased   General Gait Details: Pt requires assist management of lines, and cues for direction and sequencing with her crutches  Stairs            Wheelchair Mobility    Modified Rankin (Stroke Patients Only)       Balance Overall balance assessment: No apparent balance deficits (not formally assessed)                                           Pertinent Vitals/Pain Pain Assessment: 0-10 Pain Score: 6  Pain Location: L hip  Pain Intervention(s): Ice applied;Limited activity within patient's tolerance;Monitored during session;Premedicated before session    Home Living Family/patient expects to be discharged to:: Private residence Living Arrangements: Spouse/significant other Available Help at Discharge: Available 24 hours/day Type of Home: House Home Access: Stairs to enter Entrance Stairs-Rails: Right Entrance Stairs-Number of Steps: 4 Home Layout: One level   Additional Comments: Pt has had similar surgery; house is set up for her success     Prior Function Level of Independence: Independent         Comments: Pt was fully indep with ADLs and ambulation.      Hand Dominance        Extremity/Trunk Assessment   Upper Extremity Assessment: Overall WFL for tasks assessed  Lower Extremity Assessment: LLE deficits/detail   LLE Deficits / Details: Gross MMT at least 3/5 for LLE     Communication   Communication: No difficulties  Cognition Arousal/Alertness: Awake/alert Behavior During Therapy: Restless;Impulsive;Anxious Overall Cognitive Status: Within Functional Limits for tasks assessed                      General Comments      Exercises Other Exercises Other Exercises: Pt performed bilateral therex x 10 reps with  supervision - min assist for facilitation of movement and proper technique. Exercises performed: ankle pumps, quad sets, glute sets, SLR (min assist), hip abd, and SAQ.       Assessment/Plan    PT Assessment Patient needs continued PT services  PT Diagnosis Difficulty walking;Abnormality of gait   PT Problem List Decreased strength;Decreased range of motion;Decreased activity tolerance;Decreased mobility;Decreased knowledge of use of DME;Pain  PT Treatment Interventions DME instruction;Gait training;Stair training;Functional mobility training;Therapeutic activities;Therapeutic exercise;Neuromuscular re-education;Balance training;Cognitive remediation;Patient/family education;Manual techniques   PT Goals (Current goals can be found in the Care Plan section) Acute Rehab PT Goals Patient Stated Goal: to return home PT Goal Formulation: With patient Time For Goal Achievement: 08/16/15 Potential to Achieve Goals: Good    Frequency BID   Barriers to discharge        Co-evaluation               End of Session Equipment Utilized During Treatment: Gait belt Activity Tolerance: Patient tolerated treatment well Patient left: in chair;with call bell/phone within reach;with bed alarm set;with nursing/sitter in room;with family/visitor present;with SCD's reapplied Nurse Communication: Mobility status         Time: 1610-1620 PT Time Calculation (min) (ACUTE ONLY): 10 min   Charges:         PT G CodesBenna Dunks 08-06-2015, 5:19 PM  Benna Dunks, SPT. [phone]

## 2015-08-02 NOTE — Anesthesia Preprocedure Evaluation (Addendum)
Anesthesia Evaluation  Patient identified by MRN, date of birth, ID band Patient awake    Reviewed: Allergy & Precautions, NPO status , Patient's Chart, lab work & pertinent test results, reviewed documented beta blocker date and time   History of Anesthesia Complications (+) PONV and history of anesthetic complications  Airway Mallampati: II  TM Distance: >3 FB     Dental  (+) Chipped   Pulmonary           Cardiovascular hypertension, Pt. on medications and Pt. on home beta blockers + Valvular Problems/Murmurs      Neuro/Psych  Headaches,  Neuromuscular disease    GI/Hepatic   Endo/Other    Renal/GU      Musculoskeletal  (+) Arthritis ,   Abdominal   Peds  Hematology   Anesthesia Other Findings   Reproductive/Obstetrics                            Anesthesia Physical Anesthesia Plan  ASA: III  Anesthesia Plan: General   Post-op Pain Management:    Induction:   Airway Management Planned: Oral ETT  Additional Equipment:   Intra-op Plan:   Post-operative Plan:   Informed Consent: I have reviewed the patients History and Physical, chart, labs and discussed the procedure including the risks, benefits and alternatives for the proposed anesthesia with the patient or authorized representative who has indicated his/her understanding and acceptance.     Plan Discussed with: CRNA  Anesthesia Plan Comments:        Anesthesia Quick Evaluation

## 2015-08-02 NOTE — Anesthesia Procedure Notes (Signed)
Procedure Name: Intubation Date/Time: 08/02/2015 7:27 AM Performed by: Charna Busman Pre-anesthesia Checklist: Patient identified, Emergency Drugs available, Suction available and Patient being monitored Patient Re-evaluated:Patient Re-evaluated prior to inductionOxygen Delivery Method: Circle system utilized Preoxygenation: Pre-oxygenation with 100% oxygen Intubation Type: IV induction and Combination inhalational/ intravenous induction Ventilation: Mask ventilation without difficulty Laryngoscope Size: Miller and 3 Grade View: Grade II Tube type: Oral Tube size: 7.0 mm Number of attempts: 1 Airway Equipment and Method: Stylet Placement Confirmation: ETT inserted through vocal cords under direct vision,  positive ETCO2,  CO2 detector and breath sounds checked- equal and bilateral Secured at: 21 cm Tube secured with: Tape Dental Injury: Teeth and Oropharynx as per pre-operative assessment

## 2015-08-02 NOTE — H&P (Signed)
The patient has been re-examined, and the chart reviewed, and there have been no interval changes to the documented history and physical.    The risks, benefits, and alternatives have been discussed at length. The patient expressed understanding of the risks benefits and agreed with plans for surgical intervention.  James P. Hooten, Jr. M.D.    

## 2015-08-02 NOTE — Plan of Care (Signed)
Problem: Phase III Progression Outcomes Goal: Incision clean - minimal/no drainage Outcome: Progressing Incision clean, dry and intact.

## 2015-08-02 NOTE — Progress Notes (Signed)
Pt complaints of nuero pain to legs. Scheduled Gabapentin given. Pt incision to left hip dry and intact. Pt able to reposition self in bed. Has no complaints of pain at present. Will continue to assess and monitor for safety.

## 2015-08-02 NOTE — Progress Notes (Signed)
To OR with thermal cap in place, ancef, txa and sacral dressing sent to OR with patient

## 2015-08-02 NOTE — Transfer of Care (Signed)
Immediate Anesthesia Transfer of Care Note  Patient: [name]  Procedure(s) Performed: Procedure(s): TOTAL HIP ARTHROPLASTY (Left)  Patient Location: PACU  Anesthesia Type:General  Level of Consciousness: awake, oriented and patient cooperative  Airway & Oxygen Therapy: Patient Spontanous Breathing and Patient connected to face mask oxygen  Post-op Assessment: Report given to RN and Post -op Vital signs reviewed and stable  Post vital signs: Reviewed and stable  Last Vitals:  Filed Vitals:   08/02/15 0600  BP: 132/76  Pulse: 69  Temp: 36.8 C  Resp: 16    Complications: No apparent anesthesia complications

## 2015-08-02 NOTE — Progress Notes (Signed)
Pt took her own gabapentin preop / no water 0645 as ok'd by Dr Maisie Fus when in to see pt

## 2015-08-03 LAB — BASIC METABOLIC PANEL
ANION GAP: 7 (ref 5–15)
BUN: 11 mg/dL (ref 6–20)
CALCIUM: 8.3 mg/dL — AB (ref 8.9–10.3)
CHLORIDE: 102 mmol/L (ref 101–111)
CO2: 28 mmol/L (ref 22–32)
CREATININE: 1.05 mg/dL — AB (ref 0.44–1.00)
GFR calc non Af Amer: 55 mL/min — ABNORMAL LOW (ref >60)
GLUCOSE: 121 mg/dL — AB (ref 65–99)
Potassium: 4 mmol/L (ref 3.5–5.1)
Sodium: 137 mmol/L (ref 135–145)

## 2015-08-03 LAB — CBC
HCT: 31.1 % — ABNORMAL LOW (ref 35.0–47.0)
Hemoglobin: 10.5 g/dL — ABNORMAL LOW (ref 12.0–16.0)
MCH: 31 pg (ref 26.0–34.0)
MCHC: 33.8 g/dL (ref 32.0–36.0)
MCV: 91.7 fL (ref 80.0–100.0)
Platelets: 216 10*3/uL (ref 150–440)
RBC: 3.39 MIL/uL — ABNORMAL LOW (ref 3.80–5.20)
RDW: 13.3 % (ref 11.5–14.5)
WBC: 7.8 10*3/uL (ref 3.6–11.0)

## 2015-08-03 NOTE — Anesthesia Postprocedure Evaluation (Signed)
  Anesthesia Post-op Note  Patient: [name]  Procedure(s) Performed: Procedure(s): TOTAL HIP ARTHROPLASTY (Left)  Anesthesia type:General  Patient location: PACU  Post pain: Pain level controlled  Post assessment: Post-op Vital signs reviewed, Patient's Cardiovascular Status Stable, Respiratory Function Stable, Patent Airway and No signs of Nausea or vomiting  Post vital signs: Reviewed and stable  Last Vitals:  Filed Vitals:   08/03/15 0858  BP: 104/53  Pulse: 93  Temp: 36.8 C  Resp: 18    Level of consciousness: awake, alert  and patient cooperative  Complications: No apparent anesthesia complications

## 2015-08-03 NOTE — Progress Notes (Signed)
   Subjective: 1 Day Post-Op Procedure(s) (LRB): TOTAL HIP ARTHROPLASTY (Left) Patient reports pain as 3 on 0-10 scale.   Patient is well, and has had no acute complaints or problems We will start therapy today.  Plan is to go Home after hospital stay. no nausea and no vomiting Patient denies any chest pains or shortness of breath. Patient complained of some neuropathy of right leg for which gabapentin was given. This is a chronic condition. Had prior to admission Objective: Vital signs in last 24 hours: Temp:  [97 F (36.1 C)-98.3 F (36.8 C)] 98.3 F (36.8 C) (09/08 0547) Pulse Rate:  [46-96] 93 (09/08 0550) Resp:  [13-19] 19 (09/08 0547) BP: (76-154)/(45-84) 82/47 mmHg (09/08 0550) SpO2:  [94 %-100 %] 94 % (09/08 0547) well approximated incision Heels are non tender and elevated off the bed using rolled towels Intake/Output from previous day: 09/07 0701 - 09/08 0700 In: 3228.3 [P.O.:340; I.V.:2738.3; IV Piggyback:150] Out: 5165 [Urine:4550; Drains:465; Blood:150] Intake/Output this shift: Total I/O In: -  Out: 300 [Urine:300]   Recent Labs  08/03/15 0457  HGB 10.5*    Recent Labs  08/03/15 0457  WBC 7.8  RBC 3.39*  HCT 31.1*  PLT 216    Recent Labs  08/02/15 0613 08/03/15 0457  NA  --  137  K 3.4* 4.0  CL  --  102  CO2  --  28  BUN  --  11  CREATININE  --  1.05*  GLUCOSE  --  121*  CALCIUM  --  8.3*   No results for input(s): LABPT, INR in the last 72 hours.  EXAM General - Patient is Alert, Appropriate and Oriented Extremity - Neurologically intact Neurovascular intact Sensation intact distally Intact pulses distally Dorsiflexion/Plantar flexion intact to the left lower extremity Dressing - dressing C/D/I Motor Function - intact, moving foot and toes well on exam.   Past Medical History  Diagnosis Date  . Scoliosis   . Heart murmur     for years, not heard now  . Hypertension   . Headache(784.0)     non post menopausal  .  Neuropathic bladder   . UTI (lower urinary tract infection)     Dut to neuropathic bladder  . History of blood transfusion     post back surgeries- '90's  . Complication of anesthesia   . PONV (postoperative nausea and vomiting)     Assessment/Plan: 1 Day Post-Op Procedure(s) (LRB): TOTAL HIP ARTHROPLASTY (Left) Active Problems:   S/P total hip arthroplasty  Estimated body mass index is 26.26 kg/(m^2) as calculated from the following:   Height as of this encounter: 5' 2.5" (1.588 m).   Weight as of this encounter: 66.225 kg (146 lb). Advance diet Up with therapy D/C IV fluids Discharge home with home health on Saturday  Labs: Were reviewed DVT Prophylaxis - Lovenox, Foot Pumps and TED hose Weight-Bearing as tolerated to left leg Begin working on a bowel movement May discontinue IV fluids when patient has sufficient intake. D/C O2 and Pulse OX and try on Room [employer] [facility] 08/03/2015, 7:38 AM

## 2015-08-03 NOTE — Progress Notes (Signed)
Physical Therapy Treatment Patient Details Name: [name] MRN: [id number] DOB: November 01, 1951 Today's Date: 08/03/2015    History of Present Illness Pt is a 64 yo female who was admitted to the hospital s/p L THA performed 08/03/15. Additionally, pt currently ambulates with crutches and a hx of R BKA    PT Comments    Pt progressing towards goals this morning with ambulation with crutches. She still requires quite a bit of cueing for maintenance of precautions and slowing down her movements to keep on the same page as her therapist. Will continue to progress ambulation this afternoon. She still has strength, ROM, and mobility deficits that will benefit from skilled PT in order for her to return home safely.   Follow Up Recommendations  Home health PT     Equipment Recommendations  None recommended by PT    Recommendations for Other Services       Precautions / Restrictions Precautions Precautions: Fall Required Braces or Orthoses:  (Prosthetic on R side) Restrictions Weight Bearing Restrictions: Yes Other Position/Activity Restrictions: WBAT    Mobility  Bed Mobility Overal bed mobility: Needs Assistance Bed Mobility: Supine to Sit     Supine to sit: Mod assist     General bed mobility comments: Pt needs assist for LE management. During bed mobility pt needs heavy cueing to avoid movements that break her hip precautions. Once pain comes on, pt tends to thrash about. Needs to be calmed and cued on how to perform the activity safely and properly   Transfers Overall transfer level: Needs assistance Equipment used: Crutches Transfers: Sit to/from Stand Sit to Stand: Mod assist;From elevated surface         General transfer comment: Pt requires rolling of her prostethic gel liner prior to transfer in order for it to set in place during standing. Pt needs assist to get up using HH method and then provided her crutches.   Ambulation/Gait Ambulation/Gait assistance: Min  guard Ambulation Distance (Feet): 50 Feet Assistive device: Crutches Gait Pattern/deviations: Antalgic;Step-through pattern Gait velocity: decreased   General Gait Details: Pt ambulates with step-through pattern with crutches and minor favoring of the left leg. Pt has no unsteadiness, and shows good strength in standing as well as use of her crutches.    Stairs            Wheelchair Mobility    Modified Rankin (Stroke Patients Only)       Balance Overall balance assessment: No apparent balance deficits (not formally assessed)                                  Cognition Arousal/Alertness: Awake/alert Behavior During Therapy: WFL for tasks assessed/performed Overall Cognitive Status: Within Functional Limits for tasks assessed                      Exercises Other Exercises Other Exercises: Pt performed bilateral therex x 12 reps with supervision - min assist for facilitation of movement and proper technique. Exercises performed: ankle pumps, quad sets, glute sets, SLR (min assist), hip abd, and SAQ.     General Comments        Pertinent Vitals/Pain Pain Assessment: 0-10 Pain Score: 4  Pain Location: L hip  Pain Intervention(s): Limited activity within patient's tolerance;Monitored during session;Premedicated before session;Ice applied    Home Living Family/patient expects to be discharged to:: Private residence Living Arrangements: Spouse/significant other Available Help  at Discharge: Available 24 hours/day Type of Home: House Home Access: Stairs to enter Entrance Stairs-Rails: Right Home Layout: One level        Prior Function Level of Independence: Independent          PT Goals (current goals can now be found in the care plan section) Acute Rehab PT Goals Patient Stated Goal: to get up and walk PT Goal Formulation: With patient Time For Goal Achievement: 08/16/15 Potential to Achieve Goals: Good Progress towards PT goals:  Progressing toward goals    Frequency  BID    PT Plan Current plan remains appropriate    Co-evaluation             End of Session Equipment Utilized During Treatment: Gait belt Activity Tolerance: Patient tolerated treatment well Patient left: in chair;with call bell/phone within reach;with bed alarm set;with nursing/sitter in room;with family/visitor present;with SCD's reapplied     Time: 1610-9604 PT Time Calculation (min) (ACUTE ONLY): 24 min  Charges:                       G CodesBenna Dunks August 30, 2015, 12:06 PM Benna Dunks, SPT. [phone]

## 2015-08-03 NOTE — Care Management Note (Signed)
Case Management Note  Patient Details  Name: [name] MRN: [id number] Date of Birth: 04-18-1951  Subjective/Objective:                   Met with patient to discuss discharge planning. She plans to return home with her husband at discharge. She states she "is a amputee and has only required crutches to ambulates and intends to only use crutches at discharge". She states PT worked with her on crutched. She refused RNCM obtaining a rolling walker. She understands how to administer Lovenox. She would like to use [facility]. Action/Plan:  List of home health providers delivered. Referral called to [name]. [facility]m #14 [facility] [employer]([phone] RNCM will continue to follow.   Expected Discharge Date:                  Expected Discharge Plan:     In-House Referral:     Discharge planning Services  CM Consult  Post Acute Care Choice:  Durable Medical Equipment, Home Health Choice offered to:  Patient  DME Arranged:   (patient refused walker) DME Agency:     HH [place]  [place] Status of Service:  In process, will continue to follow  Medicare Important Message Given:    Date Medicare IM Given:    Medicare IM give by:    Date Additional Medicare IM Given:    Additional Medicare Important Message give by:     If [place], dates discussed:    Additional Comments:  AMarshell Garfinkel RN 08/03/2015, 1:06 PM

## 2015-08-03 NOTE — Progress Notes (Signed)
Physical Therapy Treatment Patient Details Name: [name] MRN: [id number] DOB: Jan 22, 1951 Today's Date: 08/03/2015    History of Present Illness Pt is a 64 yo female who was admitted to the hospital s/p L THA performed 08/03/15. Additionally, pt currently ambulates with crutches and a hx of R BKA    PT Comments    Pt making progress with rehab as she is ambulating greater distances and demonstrating greater independence with bed mobility and transfers. She still needs occasional cues to follow the therapist's instructions during mobility. Pt appears to be managing her pain well. She has acute ROM, strength, and gait deficits that will continue to benefit from skilled PT in order to return home safely.   Follow Up Recommendations  Home health PT     Equipment Recommendations  None recommended by PT    Recommendations for Other Services       Precautions / Restrictions Precautions Precautions: Fall Required Braces or Orthoses:  (R prosthesis) Restrictions Weight Bearing Restrictions: Yes Other Position/Activity Restrictions: WBAT    Mobility  Bed Mobility Overal bed mobility: Needs Assistance Bed Mobility: Supine to Sit     Supine to sit: Min assist     General bed mobility comments: Bed mobility improving this afternoon and pt better able to manage own LE and maintain precautions. Still needs cueing for LE positioning  Transfers Overall transfer level: Needs assistance Equipment used: Crutches Transfers: Sit to/from Stand Sit to Stand: Min guard;From elevated surface         General transfer comment: Pt able to perform her transfers with good functional strength and increased time. She does require cueing not to perform mobility when therapy isn't ready. Continue to monitor closely for pt safety. Notified her that she is at risk of falling and breaking precautions.   Ambulation/Gait Ambulation/Gait assistance: Min guard Ambulation Distance (Feet): 120  Feet Assistive device: Crutches Gait Pattern/deviations: Antalgic;Step-through pattern Gait velocity: decreased Gait velocity interpretation: Below normal speed for age/gender General Gait Details: Pt ambulates with step-through pattern with crutches and minor favoring of the left leg. Pt has no unsteadiness, and shows good strength in standing as well as use of her crutches.    Stairs            Wheelchair Mobility    Modified Rankin (Stroke Patients Only)       Balance Overall balance assessment: No apparent balance deficits (not formally assessed)                                  Cognition Arousal/Alertness: Awake/alert Behavior During Therapy: WFL for tasks assessed/performed Overall Cognitive Status: Within Functional Limits for tasks assessed                      Exercises Other Exercises Other Exercises: Pt performed bilateral therex x 12 reps with supervision - min assist for facilitation of movement and proper technique. Exercises performed: ankle pumps, quad sets, glute sets, SLR (min assist), hip abd, and SAQ.     General Comments        Pertinent Vitals/Pain Pain Assessment: No/denies pain    Home Living                      Prior Function            PT Goals (current goals can now be found in the care plan section) Acute  Rehab PT Goals Patient Stated Goal: to continue walking with therapy PT Goal Formulation: With patient Time For Goal Achievement: 08/16/15 Potential to Achieve Goals: Good Progress towards PT goals: Progressing toward goals    Frequency  BID    PT Plan Current plan remains appropriate    Co-evaluation             End of Session Equipment Utilized During Treatment: Gait belt Activity Tolerance: Patient tolerated treatment well Patient left: in bed;with bed alarm set;with SCD's reapplied;with call bell/phone within reach;with family/visitor present     Time: 1436-1500 PT Time  Calculation (min) (ACUTE ONLY): 24 min  Charges:                       G CodesBenna Dunks 08-20-15, 4:46 PM  Benna Dunks, SPT. [phone]

## 2015-08-03 NOTE — Progress Notes (Signed)
Ambulated patient to bathroom using crutches and her prosthesis.  Pt was able to perform her self catheterization and voided a large amount of clear yellow urine.

## 2015-08-03 NOTE — Evaluation (Signed)
Occupational Therapy Evaluation Patient Details Name: [name] MRN: [id number] DOB: July 16, 1951 Today's Date: 08/03/2015    History of Present Illness Pt is a 65 yo female who was admitted to the hospital s/p L THA performed 08/03/15. Additionally, pt currently ambulates with crutches and a hx of R BKA   Clinical Impression   This patient is a 64 year old female who came to [facility] for a L total hip replacement (posterior approach) She has had her right hip replaced and revised and is a R below knee amputee.   Patient lives in a home with her husband.   She had been independent with ADL and functional mobility. She reports she has the hip kit and knows how to use it  She reports she does not were socks. She did demonstrate proper way to put on pants with reacher. Will be available if needed.      Follow Up Recommendations       Equipment Recommendations       Recommendations for Other Services       Precautions / Restrictions Precautions Precautions: Fall Restrictions Other Position/Activity Restrictions: WBAT      Mobility Bed Mobility                  Transfers                      Balance                                            ADL                                         General ADL Comments: Had been independent with BADL. She demonstrated proper way to put on pants with reacher and reports she does not were socks this time of year. She reports this is her 3 hip surgery and she knows what to do.      Vision     Perception     Praxis      Pertinent Vitals/Pain       Hand Dominance     Extremity/Trunk Assessment           Communication     Cognition Arousal/Alertness: Awake/alert Behavior During Therapy: Restless;Impulsive;Anxious Overall Cognitive Status: Within Functional Limits for tasks assessed                     General Comments       Exercises        Shoulder Instructions      Home Living Family/patient expects to be discharged to:: Private residence Living Arrangements: Spouse/significant other Available Help at Discharge: Available 24 hours/day Type of Home: House Home Access: Stairs to enter [employer] of Steps: 4 Entrance Stairs-Rails: Right Home Layout: One level                          Prior Functioning/Environment Level of Independence: Independent             OT Diagnosis: Acute pain   OT Problem List:     OT Treatment/Interventions:      OT Goals(Current goals can be found in the care plan section)  Acute Rehab OT Goals Patient Stated Goal: to return home OT Goal Formulation: With patient/family Time For Goal Achievement: 08/17/15 Potential to Achieve Goals: Good  OT Frequency:     Barriers to D/C:            Co-evaluation              End of Session Equipment Utilized During Treatment:  (Hip kit)  Activity Tolerance: Patient tolerated treatment well Patient left: in chair;with call bell/phone within reach;with chair alarm set;with family/visitor present   Time: 1130-1145 OT Time Calculation (min): 15 min Charges:  OT General Charges $OT Visit: 1 Procedure OT Evaluation $Initial OT Evaluation Tier I: 1 Procedure G-Codes:    Stephanie Galas, MS/OTR/L  08/03/2015, 11:47 AM

## 2015-08-04 LAB — CBC
HEMATOCRIT: 29.9 % — AB (ref 35.0–47.0)
HEMOGLOBIN: 10 g/dL — AB (ref 12.0–16.0)
MCH: 30.5 pg (ref 26.0–34.0)
MCHC: 33.4 g/dL (ref 32.0–36.0)
MCV: 91.3 fL (ref 80.0–100.0)
Platelets: 236 10*3/uL (ref 150–440)
RBC: 3.28 MIL/uL — ABNORMAL LOW (ref 3.80–5.20)
RDW: 12.9 % (ref 11.5–14.5)
WBC: 9.1 10*3/uL (ref 3.6–11.0)

## 2015-08-04 LAB — BASIC METABOLIC PANEL
ANION GAP: 7 (ref 5–15)
BUN: 11 mg/dL (ref 6–20)
CHLORIDE: 99 mmol/L — AB (ref 101–111)
CO2: 30 mmol/L (ref 22–32)
Calcium: 8.4 mg/dL — ABNORMAL LOW (ref 8.9–10.3)
Creatinine, Ser: 0.94 mg/dL (ref 0.44–1.00)
GFR calc non Af Amer: >60 mL/min (ref >60)
Glucose, Bld: 127 mg/dL — ABNORMAL HIGH (ref 65–99)
Potassium: 3.4 mmol/L — ABNORMAL LOW (ref 3.5–5.1)
Sodium: 136 mmol/L (ref 135–145)

## 2015-08-04 MED ORDER — OXYCODONE HCL 5 MG PO TABS: 5.0000 mg | ORAL_TABLET | ORAL | Status: DC | PRN

## 2015-08-04 MED ORDER — TRAMADOL HCL 50 MG PO TABS: 50.0000 mg | ORAL_TABLET | ORAL | Status: DC | PRN

## 2015-08-04 MED ORDER — LACTULOSE 10 GM/15ML PO SOLN: 20.0000 g | Freq: Two times a day (BID) | ORAL | Status: DC | PRN

## 2015-08-04 MED ORDER — ENOXAPARIN SODIUM 40 MG/0.4ML ~~LOC~~ SOLN: 40.0000 mg | SUBCUTANEOUS | Status: DC

## 2015-08-04 NOTE — Progress Notes (Signed)
Physical Therapy Treatment Patient Details Name: [name] MRN: [id number] DOB: 1951-04-16 Today's Date: 08/04/2015    History of Present Illness Pt is a 64 yo female who was admitted to the hospital s/p L THA performed 08/03/15. Additionally, pt currently ambulates with crutches and a hx of R BKA    PT Comments    Pt making great progress this afternoon with ambulating around the nursing station and practicing stairs. Pt is very motivated to participate in her therapy and shows good strength and knowledge of her crutches with mobility tasks. She still has ROM, strength, and gait deficits that will continue to benefit from skilled PT in order for her to return home safely. Pt still somewhat impulsive; needs to continue to be monitored closely during mobility.   Follow Up Recommendations  Home health PT     Equipment Recommendations  None recommended by PT    Recommendations for Other Services       Precautions / Restrictions Precautions Precautions: Posterior Hip;Fall Precaution Booklet Issued: Yes (comment) (Exercise packet issued) Required Braces or Orthoses:  (RLE prosthesis ) Restrictions Weight Bearing Restrictions: Yes Other Position/Activity Restrictions: WBAT    Mobility  Bed Mobility Overal bed mobility:  (Not assessed this afternoon)                Transfers Overall transfer level: Needs assistance Equipment used: Crutches Transfers: Sit to/from Stand Sit to Stand: Min guard;From elevated surface         General transfer comment: Pt with good functional strength in getting into standing with her leg propped out. She does the same for sitting down. Still impulsive; continue to monitor  Ambulation/Gait Ambulation/Gait assistance: Min guard Ambulation Distance (Feet): 260 Feet Assistive device: Crutches Gait Pattern/deviations: Step-through pattern;Antalgic Gait velocity: decreased (quicker this afternoon) Gait velocity interpretation: Below normal  speed for age/gender General Gait Details: Pt ambulation smoothes out with cue to walk faster. Safe and stable, no LOBs.   Stairs Stairs: Yes Stairs assistance: Min guard Stair Management: Two rails Number of Stairs: 4 General stair comments: Pt able to navigate stairs with good confidence and use of handrails. At the top, pt still needs cueing to turn her left foot outward before turning left in order to maintain her hip precautions.   Wheelchair Mobility    Modified Rankin (Stroke Patients Only)       Balance Overall balance assessment: No apparent balance deficits (not formally assessed)                                  Cognition Arousal/Alertness: Awake/alert Behavior During Therapy: WFL for tasks assessed/performed Overall Cognitive Status: Within Functional Limits for tasks assessed                      Exercises Other Exercises Other Exercises: Pt performed bilateral therex x 12 reps with supervision - min assist for facilitation of movement and proper technique. Exercises performed: ankle pumps, quad sets, glute sets, LAQ (min assist), hip abd, and SAQ.     General Comments        Pertinent Vitals/Pain Pain Assessment: No/denies pain    Home Living                      Prior Function            PT Goals (current goals can now be found in the care  plan section) Acute Rehab PT Goals Patient Stated Goal: to practice stairs PT Goal Formulation: With patient Time For Goal Achievement: 08/16/15 Potential to Achieve Goals: Good Progress towards PT goals: Progressing toward goals    Frequency  BID    PT Plan Current plan remains appropriate    Co-evaluation             End of Session Equipment Utilized During Treatment: Gait belt Activity Tolerance: Patient tolerated treatment well Patient left: in chair;with chair alarm set;with call bell/phone within reach;with nursing/sitter in room     Time: 1610-9604 PT Time  Calculation (min) (ACUTE ONLY): 23 min  Charges:                       G CodesBenna Dunks 08-14-15, 3:50 PM Benna Dunks, SPT. [phone]

## 2015-08-04 NOTE — Progress Notes (Signed)
Physical Therapy Treatment Patient Details Name: [name] MRN: [id number] DOB: [birth year] Today's Date: 08/04/2015    History of Present Illness Pt is a 64 yo female who was admitted to the hospital s/p L THA performed 08/03/15. Additionally, pt currently ambulates with crutches and a hx of R BKA    PT Comments    Pt making progress this morning with her mobility and transfers. Still needs reinforcement on techniques to avoid breaking hip precautions, but is generally understanding these techniques better. In chart review, it was noted that pt has been having low blood pressure, however she has not been symptomatic during any ambulation. Pt still has pain, ROM, strength, and gait deficits that will continue to benefit from skilled PT in order for her to return home safely.   Follow Up Recommendations  Home health PT     Equipment Recommendations  None recommended by PT    Recommendations for Other Services       Precautions / Restrictions Precautions Precautions: Posterior Hip;Fall Precaution Booklet Issued: Yes (comment) (Exercise packet issued) Required Braces or Orthoses:  (Prosthesis on RLE ) Restrictions Weight Bearing Restrictions: Yes Other Position/Activity Restrictions: WBAT    Mobility  Bed Mobility Overal bed mobility: Needs Assistance Bed Mobility: Supine to Sit     Supine to sit: Min assist     General bed mobility comments: Pt still requires assist in managing LE. Still needs cues for hand positioning.  Transfers Overall transfer level: Needs assistance Equipment used: Crutches Transfers: Sit to/from Stand Sit to Stand: Min guard;From elevated surface         General transfer comment: Pt needs assist to get into standing. Demonstrates good propping of the LLE to maintain her hip precautions. Still needs to be monitored as she'll set her crutches to the side in standing to adjust her prosthesis. Needs to be cued to hold onto her crutches for  safety  Ambulation/Gait Ambulation/Gait assistance: Min guard Ambulation Distance (Feet): 150 Feet Assistive device: Crutches Gait Pattern/deviations: Antalgic;Step-through pattern Gait velocity: decreased Gait velocity interpretation: Below normal speed for age/gender General Gait Details: Pt ambulation smoothes out with cue to walk faster. Safe and stable, no LOBs.   Stairs            Wheelchair Mobility    Modified Rankin (Stroke Patients Only)       Balance Overall balance assessment: No apparent balance deficits (not formally assessed)                                  Cognition Arousal/Alertness: Awake/alert Behavior During Therapy: WFL for tasks assessed/performed Overall Cognitive Status: Within Functional Limits for tasks assessed       Memory:  (Recalls all precautions)              Exercises Other Exercises Other Exercises: Pt performed bilateral therex x 12 reps with supervision - min assist for facilitation of movement and proper technique. Exercises performed: ankle pumps, quad sets, glute sets, LAQ (min assist), hip abd, and SAQ.     General Comments        Pertinent Vitals/Pain Pain Assessment: No/denies pain    Home Living                      Prior Function            PT Goals (current goals can now be found in the care  plan section) Acute Rehab PT Goals Patient Stated Goal: To walk again PT Goal Formulation: With patient Time For Goal Achievement: 08/16/15 Potential to Achieve Goals: Good Progress towards PT goals: Progressing toward goals    Frequency  BID    PT Plan Current plan remains appropriate    Co-evaluation             End of Session Equipment Utilized During Treatment: Gait belt Activity Tolerance: Patient tolerated treatment well Patient left: in chair;with chair alarm set;with call bell/phone within reach;with nursing/sitter in room     Time: 0926-0950 PT Time Calculation  (min) (ACUTE ONLY): 24 min  Charges:                       G CodesBenna Dunks August 17, 2015, 10:36 AM  Benna Dunks, SPT. [phone]

## 2015-08-04 NOTE — Progress Notes (Signed)
   Subjective: 2 Days Post-Op Procedure(s) (LRB): TOTAL HIP ARTHROPLASTY (Left) Patient reports pain as 1 on 0-10 scale.   Patient is well, and has had no acute complaints or problems Continue with physical therapy today.  Plan is to go Home after hospital stay. no nausea and no vomiting Patient denies any chest pains or shortness of breath. Patient doing better today. Had a good nights rest. At somewhat of a difficult time self catheterization yesterday due to balance Objective: Vital signs in last 24 hours: Temp:  [98.2 F (36.8 C)-99.8 F (37.7 C)] 99.8 F (37.7 C) (09/09 0446) Pulse Rate:  [93-106] 102 (09/09 0446) Resp:  [18] 18 (09/09 0446) BP: (104-117)/(53-68) 110/60 mmHg (09/09 0446) SpO2:  [87 %-96 %] 87 % (09/09 0446) well approximated incision Heels are non tender and elevated off the bed using rolled towels Intake/Output from previous day: 09/08 0701 - 09/09 0700 In: 1080 [P.O.:1080] Out: 1245 [Urine:1100; Drains:145] Intake/Output this shift:     Recent Labs  08/03/15 0457 08/04/15 0534  HGB 10.5* 10.0*    Recent Labs  08/03/15 0457 08/04/15 0534  WBC 7.8 9.1  RBC 3.39* 3.28*  HCT 31.1* 29.9*  PLT 216 236    Recent Labs  08/03/15 0457 08/04/15 0534  NA 137 136  K 4.0 3.4*  CL 102 99*  CO2 28 30  BUN 11 11  CREATININE 1.05* 0.94  GLUCOSE 121* 127*  CALCIUM 8.3* 8.4*   No results for input(s): LABPT, INR in the last 72 hours.  EXAM General - Patient is Alert, Appropriate and Oriented Extremity - Neurologically intact Neurovascular intact Sensation intact distally Intact pulses distally Dorsiflexion/Plantar flexion intact Dressing - dressing C/D/I Motor Function - intact, moving foot and toes well on exam.   Past Medical History  Diagnosis Date  . Scoliosis   . Heart murmur     for years, not heard now  . Hypertension   . Headache(784.0)     non post menopausal  . Neuropathic bladder   . UTI (lower urinary tract infection)      Dut to neuropathic bladder  . History of blood transfusion     post back surgeries- '90's  . Complication of anesthesia   . PONV (postoperative nausea and vomiting)     Assessment/Plan: 2 Days Post-Op Procedure(s) (LRB): TOTAL HIP ARTHROPLASTY (Left) Active Problems:   S/P total hip arthroplasty  Estimated body mass index is 26.26 kg/(m^2) as calculated from the following:   Height as of this encounter: 5' 2.5" (1.588 m).   Weight as of this encounter: 66.225 kg (146 lb). Up with therapy Plan for discharge tomorrow Discharge home with home health  Labs: Were reviewed DVT Prophylaxis - Lovenox, Foot Pumps and TED hose left lower extremity Weight-Bearing as tolerated to left leg Hemovac was discontinued on today's visit Patient needs to have a bowel movement today.    Lynnda Shields. [facility] 08/04/2015, 7:53 AM

## 2015-08-04 NOTE — Care Management Note (Signed)
Case Management Note  Patient Details  Name: [name] MRN: [id number] Date of Birth: 08-May-1951  Subjective/Objective:    TC to [employer], price of $111.77. Patient updated and denies difficulty paying for medication.                Action/Plan:   Expected Discharge Date:                  Expected Discharge Plan:     In-House Referral:     Discharge planning Services  CM Consult  Post Acute Care Choice:  Durable Medical Equipment, Home Health Choice offered to:  Patient  DME Arranged:   (patient refused walker) DME Agency:     HH Arranged:  PT HH Agency:  [facility]  Status of Service:  In process, will continue to follow  Medicare Important Message Given:    Date Medicare IM Given:    Medicare IM give by:    Date Additional Medicare IM Given:    Additional Medicare Important Message give by:     If discussed at Long Length of Stay Meetings, dates discussed:    Additional Comments:  Marily Memos, RN 08/04/2015, 12:59 PM

## 2015-08-05 NOTE — Progress Notes (Signed)
   Subjective: 3 Days Post-Op Procedure(s) (LRB): TOTAL HIP ARTHROPLASTY (Left) Patient reports pain as 1 on 0-10 scale.   Patient is well, and has had no acute complaints or problems Continue with physical therapy today.  Plan is to go Home after hospital stay. no nausea and no vomiting Patient denies any chest pains or shortness of breath. Objective: Vital signs in last 24 hours: Temp:  [98.6 F (37 C)-100.4 F (38 C)] 99.3 F (37.4 C) (09/10 0742) Pulse Rate:  [78-95] 81 (09/10 0742) Resp:  [16-18] 16 (09/10 0742) BP: (99-117)/(57-66) 99/57 mmHg (09/10 0742) SpO2:  [95 %-99 %] 95 % (09/10 0742) well approximated incision Heels are non tender and elevated off the bed using rolled towels Intake/Output from previous day: 09/09 0701 - 09/10 0700 In: 240 [P.O.:240] Out: -  Intake/Output this shift:     Recent Labs  08/03/15 0457 08/04/15 0534  HGB 10.5* 10.0*    Recent Labs  08/03/15 0457 08/04/15 0534  WBC 7.8 9.1  RBC 3.39* 3.28*  HCT 31.1* 29.9*  PLT 216 236    Recent Labs  08/03/15 0457 08/04/15 0534  NA 137 136  K 4.0 3.4*  CL 102 99*  CO2 28 30  BUN 11 11  CREATININE 1.05* 0.94  GLUCOSE 121* 127*  CALCIUM 8.3* 8.4*   No results for input(s): LABPT, INR in the last 72 hours.  EXAM General - Patient is Alert, Appropriate and Oriented Extremity - Neurologically intact Neurovascular intact Sensation intact distally Intact pulses distally Dorsiflexion/Plantar flexion intact Dressing - dressing C/D/I Motor Function - intact, moving foot and toes well on exam.   Past Medical History  Diagnosis Date  . Scoliosis   . Heart murmur     for years, not heard now  . Hypertension   . Headache(784.0)     non post menopausal  . Neuropathic bladder   . UTI (lower urinary tract infection)     Dut to neuropathic bladder  . History of blood transfusion     post back surgeries- '90's  . Complication of anesthesia   . PONV (postoperative nausea and  vomiting)     Assessment/Plan: 3 Days Post-Op Procedure(s) (LRB): TOTAL HIP ARTHROPLASTY (Left) Active Problems:   S/P total hip arthroplasty  Estimated body mass index is 26.26 kg/(m^2) as calculated from the following:   Height as of this encounter: 5' 2.5" (1.588 m).   Weight as of this encounter: 66.225 kg (146 lb). Up with therapy Discharge home with home health  Labs: Reviewed DVT Prophylaxis - Lovenox, Foot Pumps and TED hose Weight-Bearing as tolerated to left leg Dressing changes on today's visit   Jon R. [facility] PA [facility] 08/05/2015, 8:46 AM

## 2015-08-05 NOTE — Plan of Care (Signed)
Problem: Phase III Progression Outcomes Goal: Pain controlled on oral analgesia Outcome: Completed/Met Date Met:  08/05/15 Oral pain medication for pain control Goal: Ambulates Outcome: Completed/Met Date Met:  08/05/15 Ambulating with minimal assistance Goal: Incision clean - minimal/no drainage Outcome: Completed/Met Date Met:  08/05/15 Dressing dry and intact. Goal: Anticoagulant follow-up in place Outcome: Adequate for Discharge Able to administer own Lovenox shot

## 2015-08-05 NOTE — Care Management Note (Signed)
Case Management Note  Patient Details  Name: [name] MRN: [id number] Date of Birth: July 18, 1951  Subjective/Objective:     Referral called and faxed to [facility] requesting home health PT.               Action/Plan:   Expected Discharge Date:                  Expected Discharge Plan:     In-House Referral:     Discharge planning Services  CM Consult  Post Acute Care Choice:  Durable Medical Equipment, Home Health Choice offered to:  Patient  DME Arranged:   (patient refused walker) DME Agency:     HH Arranged:  PT HH Agency:  [facility]  Status of Service:  In process, will continue to follow  Medicare Important Message Given:    Date Medicare IM Given:    Medicare IM give by:    Date Additional Medicare IM Given:    Additional Medicare Important Message give by:     If discussed at Long Length of Stay Meetings, dates discussed:    Additional Comments:  Wasim Hurlbut A, RN 08/05/2015, 9:50 AM

## 2015-08-05 NOTE — Discharge Summary (Signed)
Physician Discharge Summary  Patient ID: [name] MRN: [id number] DOB/AGE: 1951/01/13 64 y.o.  Admit date: 08/02/2015 Discharge date: 08/05/2015  Admission Diagnoses:  DEGENERATIVE OSTEOARTHRITIS LEFT HIP   Discharge Diagnoses: Patient Active Problem List   Diagnosis Date Noted  . S/P total hip arthroplasty 08/02/2015  . Encounter for pre-operative cardiovascular clearance 07/28/2015  . Hypertension 07/28/2015  . Sea sickness 07/28/2015  . Peripheral neuropathy 07/28/2015  . Charcot's joint of right foot 01/13/2014  . H/O Spinal surgery 03/01/2013  . H/O arthrodesis 03/01/2013  . Adult hypothyroidism 03/01/2013  . Bladder neurogenesis 03/01/2013  . OP (osteoporosis) 03/01/2013  . Scoliosis 03/01/2013  . Acontractile bladder 02/24/2013  . Bladder infection, chronic 02/24/2013  . Bladder sphincter dyssynergia 02/24/2013  . Incomplete bladder emptying 02/24/2013  . Urinary incontinence without sensory awareness 02/24/2013    Past Medical History  Diagnosis Date  . Scoliosis   . Heart murmur     for years, not heard now  . Hypertension   . Headache(784.0)     non post menopausal  . Neuropathic bladder   . UTI (lower urinary tract infection)     Dut to neuropathic bladder  . History of blood transfusion     post back surgeries- '90's  . Complication of anesthesia   . PONV (postoperative nausea and vomiting)      Transfusion: No transfusions given on this admission   Consultants (if any):   case management  Discharged Condition: Improved  Hospital Course: [name] is an 64 y.o. female who was admitted 08/02/2015 with a diagnosis of degenerative arthrosis left hip and went to the operating room on 08/02/2015 and underwent the above named procedures.    Surgeries:Procedure(s): TOTAL HIP ARTHROPLASTY on 08/02/2015  ANESTHESIA: general  ESTIMATED BLOOD LOSS: 150 mL  FLUIDS REPLACED: 1000 mL of crystalloid  DRAINS: 2 medium drains to a Hemovac  reservoir  IMPLANTS UTILIZED: DePuy 13.5 mm small stature AML femoral stem, 52 mm OD Pinnacle 100 acetabular component, +4 mm neutral Pinnacle Marathon polyethylene insert, and a 36 mm M-SPEC +1.5 mm hip ball  INDICATIONS FOR SURGERY: [name] is a 68 y.o. year old female with a long history of progressive hip and groin pain. X-rays demonstrated severe degenerative changes. The patient had not seen any significant improvement despite conservative nonsurgical intervention. After discussion of the risks and benefits of surgical intervention, the patient expressed understanding of the risks benefits and agree with plans for total hip arthroplasty.   The risks, benefits, and alternatives were discussed at length including but not limited to the risks of infection, bleeding, nerve injury, stiffness, blood clots, the need for revision surgery, limb length inequality, dislocation, cardiopulmonary complications, among others, and they were willing to proceed. Patient tolerated the surgery well. No complications .Patient was taken to PACU where she was stabilized and then transferred to the orthopedic floor.  Patient started on Lovenox 30 mg q 12 hrs. Foot pumps applied bilaterally at 80 mm hg. Heels elevated off bed with rolled towels. No evidence of DVT. Calves non tender. Negative Homan. Physical therapy started on day #1 for gait training and transfer with OT starting on  day #1 for ADL and assisted devices. Patient has done well with therapy. Ambulated greater than 200 feet upon being discharged. Was able to work 4 steps safely. Able to get in and out of bed safely. Patient could recall hip precautions.  Patient's IV , foley discontinued on day #1 and hemovac was d/c on  day #2.   She was given perioperative antibiotics:  Anti-infectives    Start     Dose/Rate Route Frequency Ordered Stop   08/02/15 1315  ceFAZolin (ANCEF) IVPB 2 g/50 mL premix     2 g 100 mL/hr over 30 Minutes Intravenous Every  6 hours 08/02/15 1311 08/03/15 0849   08/02/15 0600  ceFAZolin (ANCEF) IVPB 2 g/50 mL premix     2 g 100 mL/hr over 30 Minutes Intravenous  Once 08/02/15 0549 08/02/15 0813   08/02/15 0545  ceFAZolin (ANCEF) 2-3 GM-% IVPB SOLR    Comments:  Nicholes Rough: cabinet override      08/02/15 0545 08/02/15 1744    .  She was fitted with AV 1 foot compression devices, early ambulation, and Lovenox and TED stockings for DVT prophylaxis.  She benefited maximally from the hospital stay and there were no complications.    Recent vital signs:  Filed Vitals:   08/05/15 0742  BP: 99/57  Pulse: 81  Temp: 99.3 F (37.4 C)  Resp: 16    Recent laboratory studies:  Lab Results  Component Value Date   HGB 10.0* 08/04/2015   HGB 10.5* 08/03/2015   HGB 12.7 07/27/2015   Lab Results  Component Value Date   WBC 9.1 08/04/2015   PLT 236 08/04/2015   Lab Results  Component Value Date   INR 0.99 07/27/2015   Lab Results  Component Value Date   NA 136 08/04/2015   K 3.4* 08/04/2015   CL 99* 08/04/2015   CO2 30 08/04/2015   BUN 11 08/04/2015   CREATININE 0.94 08/04/2015   GLUCOSE 127* 08/04/2015    Discharge Medications:     Medication List    STOP taking these medications        oxyCODONE-acetaminophen 5-325 MG per tablet  Commonly known as:  PERCOCET/ROXICET      TAKE these medications        atenolol 25 MG tablet  Commonly known as:  TENORMIN  Take 1 tablet (25 mg total) by mouth every morning.     Bladder Control Pads Ex Absorb Misc     Cranberry 1000 MG Caps  Take 3,600 mg by mouth daily.     diphenhydramine-acetaminophen 25-500 MG Tabs  Commonly known as:  TYLENOL PM  Take 2 tablets by mouth at bedtime.     enoxaparin 40 MG/0.4ML injection  Commonly known as:  LOVENOX  Inject 0.4 mLs (40 mg total) into the skin daily.     ferrous fumarate 325 (106 FE) MG Tabs tablet  Commonly known as:  HEMOCYTE - 106 mg FE  Take 1 tablet by mouth.     gabapentin 300 MG  capsule  Commonly known as:  NEURONTIN  Take 2 capsules (600 mg total) by mouth 2 (two) times daily.     oxyCODONE 5 MG immediate release tablet  Commonly known as:  Oxy IR/ROXICODONE  Take 1-2 tablets (5-10 mg total) by mouth every 4 (four) hours as needed for severe pain.     potassium chloride 10 MEQ tablet  Commonly known as:  K-DUR  Take 1 tablet (10 mEq total) by mouth daily.     scopolamine 1 MG/3DAYS  Commonly known as:  TRANSDERM-SCOP (1.5 MG)  Place 1 patch (1.5 mg total) onto the skin every 3 (three) days.     senna 8.6 MG Tabs tablet  Commonly known as:  SENOKOT  Take 2 tablets by mouth daily.     Sennosides 15  MG Tabs     traMADol 50 MG tablet  Commonly known as:  ULTRAM  Take 1-2 tablets (50-100 mg total) by mouth every 4 (four) hours as needed for moderate pain.     triamterene-hydrochlorothiazide 37.5-25 MG per capsule  Commonly known as:  DYAZIDE  Take 1 each (1 capsule total) by mouth every morning.     vitamin C 500 MG tablet  Commonly known as:  ASCORBIC ACID        Diagnostic Studies: Dg Chest 2 View  07/28/2015   CLINICAL DATA:  Preoperative examination prior to left total hip joint replacement, nonsmoker.  EXAM: CHEST  2 VIEW  COMPARISON:  Chest x-ray of January 07, 2014  FINDINGS: The lungs are well-expanded and clear. There is stable contour irregularity of the left hemidiaphragm. The heart and pulmonary vascularity are normal. The mediastinum is normal in width. There is severe S shaped thoracolumbar scoliosis which is been stabilized with Harrington rods.  IMPRESSION: There is no active cardiopulmonary disease.   Electronically Signed   By: David  [place] M.D.   On: 07/28/2015 13:52   Dg Hip Port Unilat With Pelvis 1v Left  08/02/2015   CLINICAL DATA:  Status post left hip arthroplasty  EXAM: DG HIP (WITH OR WITHOUT PELVIS) 1V PORT LEFT  COMPARISON:  None.  FINDINGS: A left hip replacement is now seen. Air is noted in the surgical bed. Two surgical  drains are noted in place. A stable right femoral prosthesis is noted.  IMPRESSION: Status post left hip replacement.   Electronically Signed   By: Alcide Clever M.D.   On: 08/02/2015 11:43    Disposition: 01-Home or Self Care      Discharge Instructions    Diet - low sodium heart healthy    Complete by:  As directed      Diet - low sodium heart healthy    Complete by:  As directed      Increase activity slowly    Complete by:  As directed      Increase activity slowly    Complete by:  As directed            Follow-up Information    Follow up with Donato Heinz, MD On 09/07/2015.   Specialty:  Orthopedic Surgery   Why:  at 10:45am   Contact information:   [address] [place] [address] [phone]        Signed: Tera Partridge 08/05/2015, 8:54 AM

## 2015-08-06 LAB — SURGICAL PATHOLOGY

## 2015-09-11 DIAGNOSIS — Z96649 Presence of unspecified artificial hip joint: Secondary | ICD-10-CM | POA: Insufficient documentation

## 2015-09-27 ENCOUNTER — Ambulatory Visit (INDEPENDENT_AMBULATORY_CARE_PROVIDER_SITE_OTHER): Payer: Commercial Managed Care - HMO | Admitting: Obstetrics and Gynecology

## 2015-09-27 ENCOUNTER — Encounter: Payer: Self-pay | Admitting: Obstetrics and Gynecology

## 2015-09-27 ENCOUNTER — Other Ambulatory Visit: Payer: Self-pay | Admitting: Family Medicine

## 2015-09-27 VITALS — BP 126/79 | HR 70 | Resp 16 | Ht 62.5 in | Wt 148.5 lb

## 2015-09-27 DIAGNOSIS — N390 Urinary tract infection, site not specified: Secondary | ICD-10-CM

## 2015-09-27 DIAGNOSIS — R339 Retention of urine, unspecified: Secondary | ICD-10-CM | POA: Diagnosis not present

## 2015-09-27 DIAGNOSIS — N952 Postmenopausal atrophic vaginitis: Secondary | ICD-10-CM

## 2015-09-27 LAB — URINALYSIS, COMPLETE
BILIRUBIN UA: NEGATIVE
GLUCOSE, UA: NEGATIVE
KETONES UA: NEGATIVE
Nitrite, UA: NEGATIVE
PH UA: 6 (ref 5.0–7.5)
SPEC GRAV UA: 1.02 (ref 1.005–1.030)
Urobilinogen, Ur: 0.2 mg/dL (ref 0.2–1.0)

## 2015-09-27 LAB — MICROSCOPIC EXAMINATION: Renal Epithel, UA: NONE SEEN /hpf

## 2015-09-27 MED ORDER — ESTROGENS, CONJUGATED 0.625 MG/GM VA CREA
TOPICAL_CREAM | VAGINAL | Status: AC
Start: 2015-09-27 — End: ?

## 2015-09-27 MED ORDER — SULFAMETHOXAZOLE-TRIMETHOPRIM 800-160 MG PO TABS: 1.0000 | ORAL_TABLET | Freq: Two times a day (BID) | ORAL | Status: DC

## 2015-09-27 NOTE — Progress Notes (Signed)
09/27/2015 11:08 AM   [name] Feb 17, 1951 [id number]  Referring provider: Ellyn Hack, MD [address] [place], [place] [address]  Chief Complaint  Patient presents with  . Vaginal Atrophy    8 month f/u  . Possible UTI    HPI: Patient is a 64yo female with a history of neurogenic bladder presenting for follow up for urinary retention and frequent UTIs. She has been performing CIC 3 times daily without difficulty. She has not been using estrogen cream consistently.  PVR consistently 250-384ml with a few outliers per patient record  She reports experiencing much less leakage since beginning CIC. She was taking Bactrim 1 tab daily as UTI prophylaxis but states that she discontinued medication in June.  Symptoms today in urinary frequency and cloudy foul smelling urine.  No fevers or flank pain.   Previous complaint history: Patient is a 64yo female, with a history of neurogenic bladder, presenting today with c/o frequent urinary tract infections. Referred by Dr. Wallene Huh. She has previously been managed by Dr. Achilles Dunk at [facility] but states that she has not seen him in 2 years.  She s/p multiple spinal surgeries with subsequent neuropathy extending from the waist and below and bone degeneration leading to right BKA (2015).  She consistently has to strain to completely empty her bladder. She does not experience dysuria but notices increase in urinary frequency, foul odor and cloudy urine when she has an infection.  She is postmenopausal.  PMH: Past Medical History  Diagnosis Date  . Scoliosis   . Heart murmur     for years, not heard now  . Hypertension   . Headache(784.0)     non post menopausal  . Neuropathic bladder   . UTI (lower urinary tract infection)     Dut to neuropathic bladder  . History of blood transfusion     post back surgeries- '90's  . Complication of anesthesia   . PONV (postoperative nausea and vomiting)     Surgical  History: Past Surgical History  Procedure Laterality Date  . Back surgery    . Anterior release vertebral body w/ posterior fusion  1991    2 surgeries   . Tonsillectomy  1973  . Seportinoplasty  1980  . Cholecystectomy  1991  . Anterior and posterior spinal fusion  1995    L5- S  . Dilation and curettage of uterus  2004  . Hysteroscopy  2004  . Elbow wedge osteotomy  2005    L4  . Hardware replaced  2005  . Hardware removal  2006    T5  . P/p fusion Right 2010    2nd, 3rd, 4th  . Orif metatarsal fracture Right 2011    1 and 2  . Tendon release Right 2011  . Ankle fusion Right 2013    tendon release right 2nd toe  . Bone biospy Right     ankle  . Joint replacement Right 2008    hip  . Revision total hip arthroplasty Right 2011  . Amputation Right 01/13/2014    Procedure: AMPUTATION BELOW RIGHT KNEE;  Surgeon: Toni Arthurs, MD;  Location: [facility] OR;  Service: Orthopedics;  Laterality: Right;  . Breast surgery Left 1990    biospy non cancer  . Total hip arthroplasty Left 08/02/2015    Procedure: TOTAL HIP ARTHROPLASTY;  Surgeon: Donato Heinz, MD;  Location: [facility] ORS;  Service: Orthopedics;  Laterality: Left;    Home Medications:  Medication List       This list is accurate as of: 09/27/15 11:08 AM.  Always use your most recent med list.               atenolol 25 MG tablet  Commonly known as:  TENORMIN  Take 1 tablet (25 mg total) by mouth every morning.     Bladder Control Pads Ex Absorb Misc     conjugated estrogens vaginal cream  Commonly known as:  PREMARIN  Pea sized amount to urethra 3 times weekly at bedtime.     Cranberry 1000 MG Caps  Take 3,600 mg by mouth daily.     diphenhydramine-acetaminophen 25-500 MG Tabs tablet  Commonly known as:  TYLENOL PM  Take 2 tablets by mouth at bedtime.     gabapentin 300 MG capsule  Commonly known as:  NEURONTIN  Take 2 capsules (600 mg total) by mouth 2 (two) times daily.     scopolamine 1 MG/3DAYS  Commonly  known as:  TRANSDERM-SCOP (1.5 MG)  Place 1 patch (1.5 mg total) onto the skin every 3 (three) days.     senna 8.6 MG Tabs tablet  Commonly known as:  SENOKOT  Take 2 tablets by mouth daily.     sulfamethoxazole-trimethoprim 800-160 MG tablet  Commonly known as:  BACTRIM DS,SEPTRA DS  Take 1 tablet by mouth every 12 (twelve) hours.     triamterene-hydrochlorothiazide 37.5-25 MG capsule  Commonly known as:  DYAZIDE  Take 1 each (1 capsule total) by mouth every morning.        Allergies:  Allergies  Allergen Reactions  . Dilaudid [Hydromorphone] Shortness Of Breath    Respiratory arrest  . Benzoin Rash  . Other Rash    "Old kind of Adhesive Tape".  . Nsaids     Other reaction(s): Kidney Disorder Renal failure  . Tape Rash    Family History: Family History  Problem Relation Age of Onset  . Hypertension Mother   . Hypertension Father   . CAD Father   . Hypertension Son   . Obesity Sister   . Obesity Brother   . Hypertension Sister     Social History:  reports that she has never smoked. She has never used smokeless tobacco. She reports that she does not drink alcohol or use illicit drugs.  ROS: UROLOGY Frequent Urination?: Yes Hard to postpone urination?: No Burning/pain with urination?: Yes Get up at night to urinate?: No Leakage of urine?: Yes Urine stream starts and stops?: No Trouble starting stream?: No Do you have to strain to urinate?: No Blood in urine?: No Urinary tract infection?: Yes Sexually transmitted disease?: No Injury to kidneys or bladder?: No Painful intercourse?: No Weak stream?: No Currently pregnant?: No Vaginal bleeding?: No Last menstrual period?: n  Gastrointestinal Nausea?: No Vomiting?: No Indigestion/heartburn?: No Diarrhea?: No Constipation?: No  Constitutional Fever: No Night sweats?: No Weight loss?: No Fatigue?: No  Skin Skin rash/lesions?: No Itching?: No  Eyes Blurred vision?: No Double vision?:  No  Ears/Nose/Throat Sore throat?: No Sinus problems?: No  Hematologic/Lymphatic Swollen glands?: No Easy bruising?: No  Cardiovascular Leg swelling?: No Chest pain?: No  Respiratory Cough?: No Shortness of breath?: No  Endocrine Excessive thirst?: No  Musculoskeletal Back pain?: No Joint pain?: No  Neurological Headaches?: No Dizziness?: No  Psychologic Depression?: No Anxiety?: No  Physical Exam: BP 126/79 mmHg  Pulse 70  Resp 16  Ht 5' 2.5" (1.588 m)  Wt 148 lb 8 oz (67.359  kg)  BMI 26.71 kg/m2  Constitutional:  Alert and oriented, No acute distress, right lower extremity prothesis HEENT: [place] AT, moist mucus membranes.  Trachea midline, no masses. Cardiovascular: No clubbing, cyanosis, or edema. Respiratory: Normal respiratory effort, no increased work of breathing. GI: Abdomen is soft, nontender, nondistended, no abdominal masses GU: No CVA tenderness.  Skin: No rashes, bruises or suspicious lesions. Lymph: No cervical or inguinal adenopathy. Neurologic: Grossly intact, no focal deficits, moving all 4 extremities. Psychiatric: Normal mood and affect.  Laboratory Data:   Urinalysis    Component Value Date/Time   COLORURINE YELLOW* 07/27/2015 1600   APPEARANCEUR HAZY* 07/27/2015 1600   LABSPEC 1.012 07/27/2015 1600   PHURINE 5.0 07/27/2015 1600   GLUCOSEU NEGATIVE 07/27/2015 1600   HGBUR 1+* 07/27/2015 1600   BILIRUBINUR NEGATIVE 07/27/2015 1600   KETONESUR NEGATIVE 07/27/2015 1600   PROTEINUR NEGATIVE 07/27/2015 1600   NITRITE POSITIVE* 07/27/2015 1600   LEUKOCYTESUR 3+* 07/27/2015 1600    Pertinent Imaging:   Assessment & Plan:    1. Recurrent UTI- UA suspicious for infection today.  Bactrim sent to pharmacy pending culture results. - Urinalysis, Complete - BLADDER SCAN AMB NON-IMAGING  2. Urinary retention- Related to neurogenic bladder.  Continue with CIC. Patient tolerating well.   3. Vaginal Atrophy- Patient encouraged to use  estrogen cream regularly. Premarin cream prescription sent to pharmacy.  Return in about 1 year (around 09/26/2016) for urinary retention/ recurrent UTIs.  These notes generated with voice recognition software. I apologize for typographical errors.  Earlie LouLindsay Annise Boran, FNP  [facility] [address], [address] [place], [place] [address] [phone]

## 2015-09-28 ENCOUNTER — Ambulatory Visit (INDEPENDENT_AMBULATORY_CARE_PROVIDER_SITE_OTHER): Payer: Commercial Managed Care - HMO | Admitting: Family Medicine

## 2015-09-28 ENCOUNTER — Ambulatory Visit
Admission: RE | Admit: 2015-09-28 | Discharge: 2015-09-28 | Disposition: A | Discharge status: Home / Self Care | Payer: Commercial Managed Care - HMO | Source: Ambulatory Visit | Attending: Family Medicine | Admitting: Family Medicine

## 2015-09-28 ENCOUNTER — Encounter: Payer: Self-pay | Admitting: Family Medicine

## 2015-09-28 VITALS — BP 120/71 | HR 73 | Temp 97.6°F | Resp 17 | Ht 63.0 in | Wt 148.0 lb

## 2015-09-28 DIAGNOSIS — R05 Cough: Secondary | ICD-10-CM

## 2015-09-28 DIAGNOSIS — I10 Essential (primary) hypertension: Secondary | ICD-10-CM

## 2015-09-28 DIAGNOSIS — R0982 Postnasal drip: Secondary | ICD-10-CM

## 2015-09-28 DIAGNOSIS — J329 Chronic sinusitis, unspecified: Secondary | ICD-10-CM | POA: Diagnosis not present

## 2015-09-28 DIAGNOSIS — R058 Other specified cough: Secondary | ICD-10-CM | POA: Insufficient documentation

## 2015-09-28 DIAGNOSIS — G629 Polyneuropathy, unspecified: Secondary | ICD-10-CM

## 2015-09-28 MED ORDER — FLUTICASONE PROPIONATE 50 MCG/ACT NA SUSP: 2.0000 | Freq: Every day | NASAL | Status: DC

## 2015-09-28 MED ORDER — GABAPENTIN 300 MG PO CAPS: 600.0000 mg | ORAL_CAPSULE | Freq: Three times a day (TID) | ORAL | Status: DC

## 2015-09-28 MED ORDER — ATENOLOL 25 MG PO TABS: 25.0000 mg | ORAL_TABLET | Freq: Every morning | ORAL | Status: DC

## 2015-09-28 MED ORDER — TRIAMTERENE-HCTZ 37.5-25 MG PO CAPS: 1.0000 | ORAL_CAPSULE | Freq: Every morning | ORAL | Status: DC

## 2015-09-28 NOTE — Progress Notes (Signed)
Name: [name]   MRN: [id number]    DOB: [birth year]   Date:09/28/2015       Progress Note  Subjective  Chief Complaint  Chief Complaint  Patient presents with  . Follow-up    3 mo    Hypertension This is a chronic problem. The problem is controlled. Pertinent negatives include no chest pain, headaches, palpitations, shortness of breath or sweats. Past treatments include beta blockers and diuretics.   Neuropathic Pain Pt. Is on Gabapentin for long-standing history of neuropathic pain (present for at least 8 years). She reports numbness from the lower back, radiating to back side of left leg and medial side of right leg. She is on Gabapentin 600 mg TID, with reasonable relief of symptoms. No side effects reported.  Cough Pt. Has recurrent but intermittent productive cough since her left total Hip Replacement in September. She denies any dyspnea, chest tightness, fevers, or pain, but does have drainage and congestion in her throat. She has occasional coughing spells and takes OTC cough drops, which seem to help. She used Incentive Spirometry after surgery but not after she was discharged home.  Past Medical History  Diagnosis Date  . Scoliosis   . Heart murmur     for years, not heard now  . Hypertension   . Headache(784.0)     non post menopausal  . Neuropathic bladder   . UTI (lower urinary tract infection)     Dut to neuropathic bladder  . History of blood transfusion     post back surgeries- '90's  . Complication of anesthesia   . PONV (postoperative nausea and vomiting)     Past Surgical History  Procedure Laterality Date  . Back surgery    . Anterior release vertebral body w/ posterior fusion  1991    2 surgeries   . Tonsillectomy  1973  . Seportinoplasty  1980  . Cholecystectomy  1991  . Anterior and posterior spinal fusion  1995    L5- S  . Dilation and curettage of uterus  2004  . Hysteroscopy  2004  . Elbow wedge osteotomy  2005    L4  . Hardware  replaced  2005  . Hardware removal  2006    T5  . P/p fusion Right 2010    2nd, 3rd, 4th  . Orif metatarsal fracture Right 2011    1 and 2  . Tendon release Right 2011  . Ankle fusion Right 2013    tendon release right 2nd toe  . Bone biospy Right     ankle  . Joint replacement Right 2008    hip  . Revision total hip arthroplasty Right 2011  . Amputation Right 01/13/2014    Procedure: AMPUTATION BELOW RIGHT KNEE;  Surgeon: Toni Arthurs, MD;  Location: [facility] OR;  Service: Orthopedics;  Laterality: Right;  . Breast surgery Left 1990    biospy non cancer  . Total hip arthroplasty Left 08/02/2015    Procedure: TOTAL HIP ARTHROPLASTY;  Surgeon: Donato Heinz, MD;  Location: [facility] ORS;  Service: Orthopedics;  Laterality: Left;    Family History  Problem Relation Age of Onset  . Hypertension Mother   . Hypertension Father   . CAD Father   . Hypertension Son   . Obesity Sister   . Obesity Brother   . Hypertension Sister     Social History   Social History  . Marital Status: Married    Spouse Name: N/A  . Number of Children: N/A  .  Years of Education: N/A   Occupational History  . Not on file.   Social History Main Topics  . Smoking status: Never Smoker   . Smokeless tobacco: Never Used  . Alcohol Use: No  . Drug Use: No  . Sexual Activity: Yes   Other Topics Concern  . Not on file   Social History Narrative     Current outpatient prescriptions:  .  atenolol (TENORMIN) 25 MG tablet, Take 1 tablet (25 mg total) by mouth every morning., Disp: 90 tablet, Rfl: 0 .  conjugated estrogens (PREMARIN) vaginal cream, Pea sized amount to urethra 3 times weekly at bedtime., Disp: 42.5 g, Rfl: 12 .  Cranberry 1000 MG CAPS, Take 3,600 mg by mouth daily., Disp: , Rfl:  .  diphenhydramine-acetaminophen (TYLENOL PM) 25-500 MG TABS, Take 2 tablets by mouth at bedtime., Disp: , Rfl:  .  gabapentin (NEURONTIN) 300 MG capsule, Take 2 capsules (600 mg total) by mouth 2 (two) times daily.,  Disp: 120 capsule, Rfl: 2 .  Incontinence Supply Disposable (BLADDER CONTROL PADS EX ABSORB) MISC, , Disp: , Rfl:  .  scopolamine (TRANSDERM-SCOP, 1.5 MG,) 1 MG/3DAYS, Place 1 patch (1.5 mg total) onto the skin every 3 (three) days., Disp: 10 patch, Rfl: 0 .  senna (SENOKOT) 8.6 MG TABS tablet, Take 2 tablets by mouth daily., Disp: , Rfl:  .  sulfamethoxazole-trimethoprim (BACTRIM DS,SEPTRA DS) 800-160 MG tablet, Take 1 tablet by mouth every 12 (twelve) hours., Disp: 14 tablet, Rfl: 0 .  triamterene-hydrochlorothiazide (DYAZIDE) 37.5-25 MG per capsule, Take 1 each (1 capsule total) by mouth every morning., Disp: 90 capsule, Rfl: 0  Allergies  Allergen Reactions  . Dilaudid [Hydromorphone] Shortness Of Breath    Respiratory arrest  . Benzoin Rash  . Other Rash    "Old kind of Adhesive Tape".  . Nsaids     Other reaction(s): Kidney Disorder Renal failure  . Tape Rash     Review of Systems  Constitutional: Negative for fever, chills and weight loss.  HENT: Negative for ear pain and sore throat.   Respiratory: Positive for cough and sputum production. Negative for hemoptysis, shortness of breath and wheezing.   Cardiovascular: Negative for chest pain and palpitations.  Musculoskeletal: Positive for back pain and joint pain.  Neurological: Positive for tingling and sensory change. Negative for headaches.   Objective  Filed Vitals:   09/28/15 0955  BP: 120/71  Pulse: 73  Temp: 97.6 F (36.4 C)  TempSrc: Oral  Resp: 17  Height: 5\' 3"  (1.6 m)  Weight: 148 lb (67.132 kg)  SpO2: 98%    Physical Exam  Constitutional: She is oriented to person, place, and time and well-developed, well-nourished, and in no distress.  HENT:  Head: Normocephalic and atraumatic.  Nose: Right sinus exhibits no maxillary sinus tenderness and no frontal sinus tenderness. Left sinus exhibits no maxillary sinus tenderness and no frontal sinus tenderness.  Mouth/Throat: Posterior oropharyngeal erythema  present.  Nasal edema and erythema. oropharyngeal erythema and drainage.   Cardiovascular: Normal rate and regular rhythm.   Pulmonary/Chest: Effort normal and breath sounds normal. She has no wheezes. She has no rales.  Musculoskeletal:       Lumbar back: She exhibits no pain and no spasm.       Back:       Legs: Neurological: She is alert and oriented to person, place, and time.  Skin: Skin is warm.  Nursing note and vitals reviewed.    Assessment & Plan  1. Essential hypertension BP well controlled on present therapy - atenolol (TENORMIN) 25 MG tablet; Take 1 tablet (25 mg total) by mouth every morning.  Dispense: 90 tablet; Refill: 0 - triamterene-hydrochlorothiazide (DYAZIDE) 37.5-25 MG capsule; Take 1 each (1 capsule total) by mouth every morning.  Dispense: 90 capsule; Refill: 0  2. Peripheral polyneuropathy (HCC) Symptoms stable on gabapentin 600 mg 3 times daily. Refills provided. - gabapentin (NEURONTIN) 300 MG capsule; Take 2 capsules (600 mg total) by mouth 3 (three) times daily.  Dispense: 540 capsule; Refill: 1  3. Post-nasal drainage Recommended Flonase for relief of nasal swelling and drainage. - fluticasone (FLONASE) 50 MCG/ACT nasal spray; Place 2 sprays into both nostrils daily.  Dispense: 16 g; Refill: 0  4. Recurrent productive cough Unclear but we'll obtain chest x-ray to rule out any acute abnormality. - DG Chest 2 View; Future   Lamarcus Spira [name] 09/28/2015 10:12 AM

## 2015-10-02 LAB — CULTURE, URINE COMPREHENSIVE

## 2015-10-16 ENCOUNTER — Ambulatory Visit (INDEPENDENT_AMBULATORY_CARE_PROVIDER_SITE_OTHER): Payer: Commercial Managed Care - HMO | Admitting: Obstetrics and Gynecology

## 2015-10-16 ENCOUNTER — Encounter: Payer: Self-pay | Admitting: Obstetrics and Gynecology

## 2015-10-16 VITALS — BP 98/62 | HR 66 | Ht 62.0 in | Wt 150.6 lb

## 2015-10-16 DIAGNOSIS — G629 Polyneuropathy, unspecified: Secondary | ICD-10-CM | POA: Insufficient documentation

## 2015-10-16 DIAGNOSIS — R8299 Other abnormal findings in urine: Secondary | ICD-10-CM

## 2015-10-16 DIAGNOSIS — R350 Frequency of micturition: Secondary | ICD-10-CM | POA: Diagnosis not present

## 2015-10-16 DIAGNOSIS — R8290 Unspecified abnormal findings in urine: Secondary | ICD-10-CM

## 2015-10-16 LAB — URINALYSIS, COMPLETE
Bilirubin, UA: NEGATIVE
GLUCOSE, UA: NEGATIVE
Ketones, UA: NEGATIVE
NITRITE UA: NEGATIVE
Protein, UA: NEGATIVE
Specific Gravity, UA: 1.02 (ref 1.005–1.030)
Urobilinogen, Ur: 0.2 mg/dL (ref 0.2–1.0)
pH, UA: 6.5 (ref 5.0–7.5)

## 2015-10-16 LAB — MICROSCOPIC EXAMINATION
Epithelial Cells (non renal): >10 /hpf — ABNORMAL HIGH (ref 0–10)
RBC MICROSCOPIC, UA: NONE SEEN /HPF (ref 0–?)
WBC, UA: >30 /hpf — ABNORMAL HIGH (ref 0–?)

## 2015-10-16 MED ORDER — SULFAMETHOXAZOLE-TRIMETHOPRIM 800-160 MG PO TABS: 1.0000 | ORAL_TABLET | Freq: Two times a day (BID) | ORAL | Status: DC

## 2015-10-16 NOTE — Progress Notes (Signed)
10/16/2015 2:00 PM   [name] [name] [id number]  Referring provider: Ellyn Hack, MD [address] [place], [place] [address]  Chief Complaint  Patient presents with  . Urinary Frequency    HPI: Patient is a 64yo female with a history of neurogenic bladder presenting today with c/o urinary frequency and cloudy urine. She has been performing CIC 3 times daily without difficulty. She has not been using estrogen cream consistently.  She reports experiencing much less leakage since beginning CIC. She was taking Bactrim 1 tab daily as UTI prophylaxis but states that she discontinued medication in Stephanie.   Symptoms today in urinary frequency and cloudy foul smelling urine. No fevers or flank pain. She was recently on a cruise and states that she did not empty her bladder as frequenctly as she usually does.  Previous complaint history: Patient is a 64yo female, with a history of neurogenic bladder, presenting today with c/o frequent urinary tract infections. Referred by Dr. Wallene Huh. She has previously been managed by Dr. Achilles Dunk at [facility] but states that she has not seen him in 2 years. She s/p multiple spinal surgeries with subsequent neuropathy extending from the waist and below and bone degeneration leading to right BKA (2015). She consistently has to strain to completely empty her bladder. She does not experience dysuria but notices increase in urinary frequency, foul odor and cloudy urine when she has an infection. She is postmenopausal.     PMH: Past Medical History  Diagnosis Date  . Scoliosis   . Heart murmur     for years, not heard now  . Hypertension   . Headache(784.0)     non post menopausal  . Neuropathic bladder   . UTI (lower urinary tract infection)     Dut to neuropathic bladder  . History of blood transfusion     post back surgeries- '90's  . Complication of anesthesia   . PONV (postoperative nausea and vomiting)     Surgical  History: Past Surgical History  Procedure Laterality Date  . Back surgery    . Anterior release vertebral body w/ posterior fusion  1991    2 surgeries   . Tonsillectomy  1973  . Seportinoplasty  1980  . Cholecystectomy  1991  . Anterior and posterior spinal fusion  1995    L5- S  . Dilation and curettage of uterus  2004  . Hysteroscopy  2004  . Elbow wedge osteotomy  2005    L4  . Hardware replaced  2005  . Hardware removal  2006    T5  . P/p fusion Right 2010    2nd, 3rd, 4th  . Orif metatarsal fracture Right 2011    1 and 2  . Tendon release Right 2011  . Ankle fusion Right 2013    tendon release right 2nd toe  . Bone biospy Right     ankle  . Joint replacement Right 2008    hip  . Revision total hip arthroplasty Right 2011  . Amputation Right 01/13/2014    Procedure: AMPUTATION BELOW RIGHT KNEE;  Surgeon: Toni Arthurs, MD;  Location: [facility] OR;  Service: Orthopedics;  Laterality: Right;  . Breast surgery Left 1990    biospy non cancer  . Total hip arthroplasty Left 08/02/2015    Procedure: TOTAL HIP ARTHROPLASTY;  Surgeon: Donato Heinz, MD;  Location: [facility] ORS;  Service: Orthopedics;  Laterality: Left;    Home Medications:    Medication List  This list is accurate as of: 10/16/15  2:00 PM.  Always use your most recent med list.               atenolol 25 MG tablet  Commonly known as:  TENORMIN  Take 1 tablet (25 mg total) by mouth every morning.     Bladder Control Pads Ex Absorb Misc     conjugated estrogens vaginal cream  Commonly known as:  PREMARIN  Pea sized amount to urethra 3 times weekly at bedtime.     Cranberry 1000 MG Caps  Take 3,600 mg by mouth daily.     diphenhydramine-acetaminophen 25-500 MG Tabs tablet  Commonly known as:  TYLENOL PM  Take 2 tablets by mouth at bedtime.     fluticasone 50 MCG/ACT nasal spray  Commonly known as:  FLONASE  Place 2 sprays into both nostrils daily.     gabapentin 300 MG capsule  Commonly known as:   NEURONTIN  Take 2 capsules (600 mg total) by mouth 3 (three) times daily.     PREVNAR 13 Susp injection  Generic drug:  pneumococcal 13-valent conjugate vaccine     scopolamine 1 MG/3DAYS  Commonly known as:  TRANSDERM-SCOP (1.5 MG)  Place 1 patch (1.5 mg total) onto the skin every 3 (three) days.     senna 8.6 MG Tabs tablet  Commonly known as:  SENOKOT  Take 2 tablets by mouth daily.     sulfamethoxazole-trimethoprim 800-160 MG tablet  Commonly known as:  BACTRIM DS,SEPTRA DS  Take 1 tablet by mouth every 12 (twelve) hours.     triamterene-hydrochlorothiazide 37.5-25 MG capsule  Commonly known as:  DYAZIDE  Take 1 each (1 capsule total) by mouth every morning.        Allergies:  Allergies  Allergen Reactions  . Dilaudid [Hydromorphone] Shortness Of Breath    Respiratory arrest  . Benzoin Rash  . Other Rash    "Old kind of Adhesive Tape".  . Nsaids     Other reaction(s): Kidney Disorder Renal failure  . Tape Rash    Family History: Family History  Problem Relation Age of Onset  . Hypertension Mother   . Hypertension Father   . CAD Father   . Hypertension Son   . Obesity Sister   . Obesity Brother   . Hypertension Sister     Social History:  reports that she has never smoked. She has never used smokeless tobacco. She reports that she does not drink alcohol or use illicit drugs.  ROS: UROLOGY Frequent Urination?: Yes Hard to postpone urination?: No Burning/pain with urination?: No Get up at night to urinate?: No Leakage of urine?: Yes Urine stream starts and stops?: No Trouble starting stream?: No Do you have to strain to urinate?: No Blood in urine?: No Urinary tract infection?: Yes Sexually transmitted disease?: No Injury to kidneys or bladder?: No Painful intercourse?: No Weak stream?: No Currently pregnant?: No Vaginal bleeding?: No Last menstrual period?: No  Gastrointestinal Nausea?: No Vomiting?: No Indigestion/heartburn?:  No Diarrhea?: No Constipation?: No  Constitutional Fever: No Night sweats?: No Weight loss?: No Fatigue?: No  Skin Skin rash/lesions?: No Itching?: No  Eyes Blurred vision?: No Double vision?: No  Ears/Nose/Throat Sore throat?: No Sinus problems?: No  Hematologic/Lymphatic Swollen glands?: No Easy bruising?: No  Cardiovascular Leg swelling?: No Chest pain?: No  Respiratory Cough?: Yes Shortness of breath?: No  Endocrine Excessive thirst?: No  Musculoskeletal Back pain?: No Joint pain?: No  Neurological Headaches?: No Dizziness?:  No  Psychologic Depression?: No Anxiety?: No  Physical Exam: BP 98/62 mmHg  Pulse 66  Ht 5\' 2"  (1.575 m)  Wt 150 lb 9.6 oz (68.312 kg)  BMI 27.54 kg/m2  Constitutional:  Alert and oriented, No acute distress. HEENT:  AT, moist mucus membranes.  Trachea midline, no masses. Cardiovascular: No clubbing, cyanosis, or edema. Respiratory: Normal respiratory effort, no increased work of breathing. GU: No CVA tenderness. Skin: No rashes, bruises or suspicious lesions. Neurologic: Grossly intact, no focal deficits Psychiatric: Normal mood and affect.  Laboratory Data:   Urinalysis  Pertinent Imaging:   Assessment & Plan:    1. Urinary Tract Infection- A today sit shows greater than 30 WBCs, many bacteria otherwise unremarkable. Given patient's symptoms and history of recurrent urinary tract infections I will start her on Bactrim today pending urine culture results. She understands that typically we would await culture results that we are coming up on a long weekend due to the hospital holidays and our office will be closed.  There are no diagnoses linked to this encounter.  No Follow-up on file.  These notes generated with voice recognition software. I apologize for typographical errors.  Earlie LouLindsay Cassandra Harbold, FNP  [facility] [address], [address] [place], [place] [id number] [phone]  [phone]

## 2015-10-18 LAB — CULTURE, URINE COMPREHENSIVE

## 2015-10-23 ENCOUNTER — Telehealth: Payer: Self-pay

## 2015-10-23 NOTE — Telephone Encounter (Signed)
Spoke with pt in reference to ucx and bactrim. Pt stated she is feeling much better. Reinforced for pt to call back should s/s of UTI return/improve. Pt voiced understanding.

## 2015-10-23 NOTE — Telephone Encounter (Signed)
-----   Message from Fernanda DrumLindsay C Overton, FNP sent at 10/20/2015  5:14 PM EST ----- Please notify patient that her urine culture was positive for infection but that it is susceptible to the Bactrim I gave her.  She needs to notify out office if symptoms do not improve.  Thanks

## 2015-10-25 ENCOUNTER — Ambulatory Visit (INDEPENDENT_AMBULATORY_CARE_PROVIDER_SITE_OTHER): Payer: Commercial Managed Care - HMO | Admitting: Family Medicine

## 2015-10-25 ENCOUNTER — Encounter: Payer: Self-pay | Admitting: Family Medicine

## 2015-10-25 VITALS — BP 100/62 | HR 75 | Temp 98.3°F | Resp 17 | Ht 62.0 in | Wt 153.2 lb

## 2015-10-25 DIAGNOSIS — R05 Cough: Secondary | ICD-10-CM

## 2015-10-25 DIAGNOSIS — R058 Other specified cough: Secondary | ICD-10-CM

## 2015-10-25 MED ORDER — AZITHROMYCIN 250 MG PO TABS: ORAL_TABLET | ORAL | Status: DC

## 2015-10-25 NOTE — Progress Notes (Signed)
Name: [name]   MRN: [id number]    DOB: [birth year]   Date:10/25/2015       Progress Note  Subjective  Chief Complaint  Chief Complaint  Patient presents with  . Acute Visit    Cough    Cough This is a chronic problem. Episode onset: Since her Left Hip Replacement on September 7th, 2016. The problem has been unchanged. The cough is productive of sputum. Associated symptoms include postnasal drip. Pertinent negatives include no chest pain, chills, hemoptysis, sore throat or shortness of breath. She has tried OTC cough suppressant (Flonase for nasal congestion) for the symptoms. The treatment provided no relief. Her past medical history is significant for pneumonia. There is no history of asthma, COPD, emphysema or environmental allergies.    Past Medical History  Diagnosis Date  . Scoliosis   . Heart murmur     for years, not heard now  . Hypertension   . Headache(784.0)     non post menopausal  . Neuropathic bladder   . UTI (lower urinary tract infection)     Dut to neuropathic bladder  . History of blood transfusion     post back surgeries- '90's  . Complication of anesthesia   . PONV (postoperative nausea and vomiting)     Past Surgical History  Procedure Laterality Date  . Back surgery    . Anterior release vertebral body w/ posterior fusion  1991    2 surgeries   . Tonsillectomy  1973  . Seportinoplasty  1980  . Cholecystectomy  1991  . Anterior and posterior spinal fusion  1995    L5- S  . Dilation and curettage of uterus  2004  . Hysteroscopy  2004  . Elbow wedge osteotomy  2005    L4  . Hardware replaced  2005  . Hardware removal  2006    T5  . P/p fusion Right 2010    2nd, 3rd, 4th  . Orif metatarsal fracture Right 2011    1 and 2  . Tendon release Right 2011  . Ankle fusion Right 2013    tendon release right 2nd toe  . Bone biospy Right     ankle  . Joint replacement Right 2008    hip  . Revision total hip arthroplasty Right 2011  .  Amputation Right 01/13/2014    Procedure: AMPUTATION BELOW RIGHT KNEE;  Surgeon: Toni Arthurs, MD;  Location: [facility] OR;  Service: Orthopedics;  Laterality: Right;  . Breast surgery Left 1990    biospy non cancer  . Total hip arthroplasty Left 08/02/2015    Procedure: TOTAL HIP ARTHROPLASTY;  Surgeon: Donato Heinz, MD;  Location: [facility] ORS;  Service: Orthopedics;  Laterality: Left;    Family History  Problem Relation Age of Onset  . Hypertension Mother   . Hypertension Father   . CAD Father   . Hypertension Son   . Obesity Sister   . Obesity Brother   . Hypertension Sister     Social History   Social History  . Marital Status: Married    Spouse Name: N/A  . Number of Children: N/A  . Years of Education: N/A   Occupational History  . Not on file.   Social History Main Topics  . Smoking status: Never Smoker   . Smokeless tobacco: Never Used  . Alcohol Use: No  . Drug Use: No  . Sexual Activity: Yes   Other Topics Concern  . Not on file   Social  History Narrative     Current outpatient prescriptions:  .  atenolol (TENORMIN) 25 MG tablet, Take 1 tablet (25 mg total) by mouth every morning., Disp: 90 tablet, Rfl: 0 .  conjugated estrogens (PREMARIN) vaginal cream, Pea sized amount to urethra 3 times weekly at bedtime., Disp: 42.5 g, Rfl: 12 .  Cranberry 1000 MG CAPS, Take 3,600 mg by mouth daily., Disp: , Rfl:  .  diphenhydramine-acetaminophen (TYLENOL PM) 25-500 MG TABS, Take 2 tablets by mouth at bedtime., Disp: , Rfl:  .  gabapentin (NEURONTIN) 300 MG capsule, Take 2 capsules (600 mg total) by mouth 3 (three) times daily., Disp: 540 capsule, Rfl: 1 .  Incontinence Supply Disposable (BLADDER CONTROL PADS EX ABSORB) MISC, , Disp: , Rfl:  .  PREVNAR 13 SUSP injection, , Disp: , Rfl:  .  scopolamine (TRANSDERM-SCOP, 1.5 MG,) 1 MG/3DAYS, Place 1 patch (1.5 mg total) onto the skin every 3 (three) days., Disp: 10 patch, Rfl: 0 .  senna (SENOKOT) 8.6 MG TABS tablet, Take 2 tablets  by mouth daily., Disp: , Rfl:  .  sulfamethoxazole-trimethoprim (BACTRIM DS,SEPTRA DS) 800-160 MG tablet, Take 1 tablet by mouth every 12 (twelve) hours., Disp: 14 tablet, Rfl: 0 .  triamterene-hydrochlorothiazide (DYAZIDE) 37.5-25 MG capsule, Take 1 each (1 capsule total) by mouth every morning., Disp: 90 capsule, Rfl: 0 .  fluticasone (FLONASE) 50 MCG/ACT nasal spray, Place 2 sprays into both nostrils daily. (Patient not taking: Reported on 10/25/2015), Disp: 16 g, Rfl: 0  Allergies  Allergen Reactions  . Dilaudid [Hydromorphone] Shortness Of Breath    Respiratory arrest  . Benzoin Rash  . Other Rash    "Old kind of Adhesive Tape".  . Nsaids     Other reaction(s): Kidney Disorder Renal failure  . Tape Rash    Review of Systems  Constitutional: Negative for chills.  HENT: Positive for postnasal drip. Negative for congestion and sore throat.   Respiratory: Positive for cough and sputum production (yellowish green mucus). Negative for hemoptysis and shortness of breath.   Cardiovascular: Negative for chest pain and palpitations.  Endo/Heme/Allergies: Negative for environmental allergies.    Objective  Filed Vitals:   10/25/15 1528  BP: 100/62  Pulse: 75  Temp: 98.3 F (36.8 C)  TempSrc: Oral  Resp: 17  Height:  (1.575 m)  Weight: 153 lb 3.2 oz (69.491 kg)  SpO2: 98%    Physical Exam  Constitutional: She is oriented to person, place, and time and well-developed, well-nourished, and in no distress.  HENT:  Head: Normocephalic and atraumatic.  Right Ear: Tympanic membrane and ear canal normal.  Left Ear: Tympanic membrane and ear canal normal.  Nose: Right sinus exhibits no maxillary sinus tenderness and no frontal sinus tenderness. Left sinus exhibits no maxillary sinus tenderness and no frontal sinus tenderness.  Mouth/Throat: Posterior oropharyngeal erythema present.  Cardiovascular: Normal rate, regular rhythm and normal heart sounds.   Pulmonary/Chest: Effort  normal and breath sounds normal.  Neurological: She is alert and oriented to person, place, and time.  Nursing note and vitals reviewed.   Assessment & Plan  1. Cough productive of purulent sputum Cough lasted over 6 weeks, x-rays unremarkable.we will start on a course of Z-Pak. If coughing does not resolve, will refer to pulmonology. - azithromycin (ZITHROMAX) 250 MG tablet; 2 tabs po x day 1, then 1 tab po q day x 4 days  Dispense: 6 tablet; Refill: 0   Eliazer Hemphill [name]  [facility] 10/25/2015 3:36 PM

## 2015-10-29 ENCOUNTER — Emergency Department: Payer: Commercial Managed Care - HMO

## 2015-10-29 ENCOUNTER — Emergency Department
Admission: EM | Admit: 2015-10-29 | Discharge: 2015-10-29 | Disposition: A | Discharge status: Home / Self Care | Payer: Commercial Managed Care - HMO | Attending: Emergency Medicine | Admitting: Emergency Medicine

## 2015-10-29 ENCOUNTER — Encounter: Payer: Self-pay | Admitting: Emergency Medicine

## 2015-10-29 DIAGNOSIS — J209 Acute bronchitis, unspecified: Secondary | ICD-10-CM | POA: Diagnosis not present

## 2015-10-29 DIAGNOSIS — Z79899 Other long term (current) drug therapy: Secondary | ICD-10-CM | POA: Insufficient documentation

## 2015-10-29 DIAGNOSIS — M546 Pain in thoracic spine: Secondary | ICD-10-CM | POA: Diagnosis present

## 2015-10-29 DIAGNOSIS — R059 Cough, unspecified: Secondary | ICD-10-CM

## 2015-10-29 DIAGNOSIS — R05 Cough: Secondary | ICD-10-CM

## 2015-10-29 DIAGNOSIS — I10 Essential (primary) hypertension: Secondary | ICD-10-CM | POA: Diagnosis not present

## 2015-10-29 DIAGNOSIS — Z7952 Long term (current) use of systemic steroids: Secondary | ICD-10-CM | POA: Diagnosis not present

## 2015-10-29 DIAGNOSIS — E041 Nontoxic single thyroid nodule: Secondary | ICD-10-CM | POA: Diagnosis not present

## 2015-10-29 DIAGNOSIS — R911 Solitary pulmonary nodule: Secondary | ICD-10-CM | POA: Diagnosis not present

## 2015-10-29 DIAGNOSIS — J40 Bronchitis, not specified as acute or chronic: Secondary | ICD-10-CM

## 2015-10-29 LAB — COMPREHENSIVE METABOLIC PANEL
ALT: 33 U/L (ref 14–54)
ANION GAP: 7 (ref 5–15)
AST: 34 U/L (ref 15–41)
Albumin: 3.8 g/dL (ref 3.5–5.0)
Alkaline Phosphatase: 125 U/L (ref 38–126)
BILIRUBIN TOTAL: 0.1 mg/dL — AB (ref 0.3–1.2)
BUN: 18 mg/dL (ref 6–20)
CHLORIDE: 102 mmol/L (ref 101–111)
CO2: 31 mmol/L (ref 22–32)
Calcium: 9.3 mg/dL (ref 8.9–10.3)
Creatinine, Ser: 0.95 mg/dL (ref 0.44–1.00)
Glucose, Bld: 81 mg/dL (ref 65–99)
POTASSIUM: 3.9 mmol/L (ref 3.5–5.1)
Sodium: 140 mmol/L (ref 135–145)
TOTAL PROTEIN: 8 g/dL (ref 6.5–8.1)

## 2015-10-29 LAB — CBC WITH DIFFERENTIAL/PLATELET
Basophils Absolute: 0.1 10*3/uL (ref 0–0.1)
Basophils Relative: 1 %
Eosinophils Absolute: 0.2 10*3/uL (ref 0–0.7)
Eosinophils Relative: 2 %
HEMATOCRIT: 37.3 % (ref 35.0–47.0)
HEMOGLOBIN: 11.7 g/dL — AB (ref 12.0–16.0)
LYMPHS ABS: 1.5 10*3/uL (ref 1.0–3.6)
LYMPHS PCT: 15 %
MCH: 28.5 pg (ref 26.0–34.0)
MCHC: 31.5 g/dL — AB (ref 32.0–36.0)
MCV: 90.6 fL (ref 80.0–100.0)
MONOS PCT: 8 %
Monocytes Absolute: 0.8 10*3/uL (ref 0.2–0.9)
NEUTROS ABS: 7.4 10*3/uL — AB (ref 1.4–6.5)
NEUTROS PCT: 74 %
Platelets: 348 10*3/uL (ref 150–440)
RBC: 4.12 MIL/uL (ref 3.80–5.20)
RDW: 13.8 % (ref 11.5–14.5)
WBC: 10 10*3/uL (ref 3.6–11.0)

## 2015-10-29 LAB — FIBRIN DERIVATIVES D-DIMER (ARMC ONLY): FIBRIN DERIVATIVES D-DIMER (ARMC): 1499 — AB (ref 0–499)

## 2015-10-29 MED ORDER — PREDNISONE 10 MG PO TABS
50.0000 mg | ORAL_TABLET | Freq: Every day | ORAL | Status: DC
Start: 2015-10-29 — End: 2015-11-02

## 2015-10-29 MED ORDER — MORPHINE SULFATE (PF) 4 MG/ML IV SOLN
4.0000 mg | Freq: Once | INTRAVENOUS | Status: AC
  Administered 2015-10-29: 4 mg via INTRAVENOUS
  Filled 2015-10-29: qty 1

## 2015-10-29 MED ORDER — LEVOFLOXACIN 750 MG PO TABS: 750.0000 mg | ORAL_TABLET | Freq: Every day | ORAL | Status: AC

## 2015-10-29 MED ORDER — GUAIFENESIN-CODEINE 100-10 MG/5ML PO SOLN: 5.0000 mL | Freq: Three times a day (TID) | ORAL | Status: DC | PRN

## 2015-10-29 MED ORDER — IOHEXOL 350 MG/ML SOLN
75.0000 mL | Freq: Once | INTRAVENOUS | Status: AC | PRN
  Administered 2015-10-29: 75 mL via INTRAVENOUS
  Filled 2015-10-29: qty 75

## 2015-10-29 NOTE — Discharge Instructions (Signed)
Cough, Adult  Please call and schedule an appointment with your primary care provider. Further investigation of the thyroid nodule is necessary. Also, you will need to discuss the pulmonary nodule and follow up as deemed necessary.   A cough helps to clear your throat and lungs. A cough may last only 2-3 weeks (acute), or it may last longer than 8 weeks (chronic). Many different things can cause a cough. A cough may be a sign of an illness or another medical condition. HOME CARE  Pay attention to any changes in your cough.  Take medicines only as told by your doctor.  If you were prescribed an antibiotic medicine, take it as told by your doctor. Do not stop taking it even if you start to feel better.  Talk with your doctor before you try using a cough medicine.  Drink enough fluid to keep your pee (urine) clear or pale yellow.  If the air is dry, use a cold steam vaporizer or humidifier in your home.  Stay away from things that make you cough at work or at home.  If your cough is worse at night, try using extra pillows to raise your head up higher while you sleep.  Do not smoke, and try not to be around smoke. If you need help quitting, ask your doctor.  Do not have caffeine.  Do not drink alcohol.  Rest as needed. GET HELP IF:  You have new problems (symptoms).  You cough up yellow fluid (pus).  Your cough does not get better after 2-3 weeks, or your cough gets worse.  Medicine does not help your cough and you are not sleeping well.  You have pain that gets worse or pain that is not helped with medicine.  You have a fever.  You are losing weight and you do not know why.  You have night sweats. GET HELP RIGHT AWAY IF:  You cough up blood.  You have trouble breathing.  Your heartbeat is very fast.   This information is not intended to replace advice given to you by your health care provider. Make sure you discuss any questions you have with your health care  provider.   Document Released: 07/25/2011 Document Revised: 08/02/2015 Document Reviewed: 01/18/2015 Elsevier Interactive Patient Education [employer]2016 [employer]

## 2015-10-29 NOTE — ED Notes (Signed)
Having lower back pain without injury hx of back surgery

## 2015-10-29 NOTE — ED Provider Notes (Signed)
[facility] Emergency Department Provider Note  ____________________________________________  Time seen: Approximately 10:42 AM  I have reviewed the triage vital signs and the nursing notes.   HISTORY  Chief Complaint Back Pain   HPI DARTHY MANGANELLI is a 64 y.o. female who presents to the emergency department for evaluation of upper back pain on the right side and cough. She states that she's had a cough that has been persistent since having hip replacement in September. She's been evaluated for cough by her primary care provider. Thursday night/Friday early morning she suddenly developed pain in the right upper back. This pain is not like anything she has experienced before despite having multiple back surgeries. Pain is worse with trying to take a deep breath and moving. No relief with oxycodone.   Past Medical History  Diagnosis Date  . Scoliosis   . Heart murmur     for years, not heard now  . Hypertension   . Headache(784.0)     non post menopausal  . Neuropathic bladder   . UTI (lower urinary tract infection)     Dut to neuropathic bladder  . History of blood transfusion     post back surgeries- '90's  . Complication of anesthesia   . PONV (postoperative nausea and vomiting)     Patient Active Problem List   Diagnosis Date Noted  . Cough productive of purulent sputum 10/25/2015  . Peripheral nerve disease (HCC) 10/16/2015  . Post-nasal drainage 09/28/2015  . Recurrent productive cough 09/28/2015  . H/O total hip arthroplasty 09/11/2015  . S/P total hip arthroplasty 08/02/2015  . Encounter for pre-operative cardiovascular clearance 07/28/2015  . Hypertension 07/28/2015  . Sea sickness 07/28/2015  . Peripheral neuropathy (HCC) 07/28/2015  . Charcot's joint of right foot 01/13/2014  . H/O Spinal surgery 03/01/2013  . H/O arthrodesis 03/01/2013  . Adult hypothyroidism 03/01/2013  . Bladder neurogenesis 03/01/2013  . OP (osteoporosis)  03/01/2013  . Scoliosis 03/01/2013  . History of spinal surgery 03/01/2013  . Persons encountering health services in other specified circumstances 03/01/2013  . Acontractile bladder 02/24/2013  . Bladder infection, chronic 02/24/2013  . Bladder sphincter dyssynergia 02/24/2013  . Incomplete bladder emptying 02/24/2013  . Urinary incontinence without sensory awareness 02/24/2013    Past Surgical History  Procedure Laterality Date  . Back surgery    . Anterior release vertebral body w/ posterior fusion  1991    2 surgeries   . Tonsillectomy  1973  . Seportinoplasty  1980  . Cholecystectomy  1991  . Anterior and posterior spinal fusion  1995    L5- S  . Dilation and curettage of uterus  2004  . Hysteroscopy  2004  . Elbow wedge osteotomy  2005    L4  . Hardware replaced  2005  . Hardware removal  2006    T5  . P/p fusion Right 2010    2nd, 3rd, 4th  . Orif metatarsal fracture Right 2011    1 and 2  . Tendon release Right 2011  . Ankle fusion Right 2013    tendon release right 2nd toe  . Bone biospy Right     ankle  . Joint replacement Right 2008    hip  . Revision total hip arthroplasty Right 2011  . Amputation Right 01/13/2014    Procedure: AMPUTATION BELOW RIGHT KNEE;  Surgeon: Toni Arthurs, MD;  Location: [facility];  Service: Orthopedics;  Laterality: Right;  . Breast surgery Left 1990    biospy  non cancer  . Total hip arthroplasty Left 08/02/2015    Procedure: TOTAL HIP ARTHROPLASTY;  Surgeon: Donato HeinzJames P Hooten, MD;  Location: [facility] ORS;  Service: Orthopedics;  Laterality: Left;    Current Outpatient Rx  Name  Route  Sig  Dispense  Refill  . atenolol (TENORMIN) 25 MG tablet   Oral   Take 1 tablet (25 mg total) by mouth every morning.   90 tablet   0   . conjugated estrogens (PREMARIN) vaginal cream      Pea sized amount to urethra 3 times weekly at bedtime.   42.5 g   12   . Cranberry 1000 MG CAPS   Oral   Take 3,600 mg by mouth daily.         .  diphenhydramine-acetaminophen (TYLENOL PM) 25-500 MG TABS   Oral   Take 2 tablets by mouth at bedtime.         . fluticasone (FLONASE) 50 MCG/ACT nasal spray   Each Nare   Place 2 sprays into both nostrils daily. Patient not taking: Reported on 10/25/2015   16 g   0   . gabapentin (NEURONTIN) 300 MG capsule   Oral   Take 2 capsules (600 mg total) by mouth 3 (three) times daily.   540 capsule   1   . guaiFENesin-codeine 100-10 MG/5ML syrup   Oral   Take 5 mLs by mouth 3 (three) times daily as needed.   120 mL   0   . Incontinence Supply Disposable (BLADDER CONTROL PADS EX ABSORB) MISC               . levofloxacin (LEVAQUIN) 750 MG tablet   Oral   Take 1 tablet (750 mg total) by mouth daily.   7 tablet   0   . predniSONE (DELTASONE) 10 MG tablet   Oral   Take 5 tablets (50 mg total) by mouth daily.   25 tablet   0   . PREVNAR 13 SUSP injection                 Dispense as written.   Marland Kitchen. scopolamine (TRANSDERM-SCOP, 1.5 MG,) 1 MG/3DAYS   Transdermal   Place 1 patch (1.5 mg total) onto the skin every 3 (three) days.   10 patch   0   . senna (SENOKOT) 8.6 MG TABS tablet   Oral   Take 2 tablets by mouth daily.         Marland Kitchen. triamterene-hydrochlorothiazide (DYAZIDE) 37.5-25 MG capsule   Oral   Take 1 each (1 capsule total) by mouth every morning.   90 capsule   0     Allergies Dilaudid; Benzoin; Other; Nsaids; and Tape  Family History  Problem Relation Age of Onset  . Hypertension Mother   . Hypertension Father   . CAD Father   . Hypertension Son   . Obesity Sister   . Obesity Brother   . Hypertension Sister     Social History Social History  Substance Use Topics  . Smoking status: Never Smoker   . Smokeless tobacco: Never Used  . Alcohol Use: No    Review of Systems Constitutional: Hip replacement in September. Eyes: No visual changes. ENT: No sore throat. Cardiovascular: Denies chest pain or palpitations. Respiratory: Denies  shortness of breath. Positive for cough since surgery. Gastrointestinal: No abdominal pain.  Genitourinary: Negative for dysuria. Musculoskeletal: Pain in upper back with deep breath or movement. Skin: Negative for rash. Neurological: Negative for  headaches, focal weakness or numbness. 10-point ROS otherwise negative.  ____________________________________________   PHYSICAL EXAM:  VITAL SIGNS: ED Triage Vitals  Enc Vitals Group     BP 10/29/15 0949 139/76 mmHg     Pulse Rate 10/29/15 0949 78     Resp 10/29/15 0949 16     Temp 10/29/15 0949 97.6 F (36.4 C)     Temp Source 10/29/15 0949 Oral     SpO2 10/29/15 0949 99 %     Weight 10/29/15 0949 150 lb (68.04 kg)     Height 10/29/15 0949 5\' 2"  (1.575 m)     Head Cir --      Peak Flow --      Pain Score 10/29/15 0948 8     Pain Loc --      Pain Edu? --      Excl. in GC? --     Constitutional: Alert and oriented. Well appearing and in no acute distress. Eyes: Conjunctivae are normal. EOMI. Head: Atraumatic. Nose: No congestion/rhinnorhea. Neck: No stridor.  Respiratory: Normal respiratory effort. Lungs clear to auscultation. Musculoskeletal: Back nontender to touch or to palpation. Neurologic:  Normal speech and language. No gross focal neurologic deficits are appreciated. Speech is normal. No gait instability. Skin:  Skin is warm, dry and intact. Atraumatic. Psychiatric: Mood and affect are normal. Speech and behavior are normal.  ____________________________________________   LABS (all labs ordered are listed, but only abnormal results are displayed)  Labs Reviewed  FIBRIN DERIVATIVES D-DIMER ([facility] ONLY) - Abnormal; Notable for the following:    Fibrin derivatives D-dimer ([facility]) 1499 (*)    All other components within normal limits  CBC WITH DIFFERENTIAL/PLATELET - Abnormal; Notable for the following:    Hemoglobin 11.7 (*)    MCHC 31.5 (*)    Neutro Abs 7.4 (*)    All other components within normal limits   COMPREHENSIVE METABOLIC PANEL - Abnormal; Notable for the following:    Total Bilirubin 0.1 (*)    All other components within normal limits   ____________________________________________  RADIOLOGY  EXAM: CT ANGIOGRAPHY CHEST WITH CONTRAST  TECHNIQUE: Multidetector CT imaging of the chest was performed using the standard protocol during bolus administration of intravenous contrast. Multiplanar CT image reconstructions and MIPs were obtained to evaluate the vascular anatomy.  CONTRAST: 75mL OMNIPAQUE IOHEXOL 350 MG/ML SOLN  COMPARISON: Chest radiograph from earlier today.  FINDINGS: Mediastinum/Nodes: The study is high quality for the evaluation of pulmonary embolism. There are no filling defects in the central, lobar, segmental or subsegmental pulmonary artery branches to suggest acute pulmonary embolism. Top-normal caliber thoracic aorta, maximum ascending thoracic aortic diameter 3.9 cm. Normal caliber pulmonary arteries. Normal heart size. No pericardial fluid/thickening. Left anterior descending coronary atherosclerosis. Heterogeneous thyroid attenuation, with the suggestion of a dominant 1.6 cm hypodense left thyroid lobe nodule. Normal esophagus. No pathologically enlarged axillary, mediastinal or hilar lymph nodes.  Lungs/Pleura: No pneumothorax. No pleural effusion. Basilar right upper lobe 5 mm solid pulmonary nodule (series 6/ image 70) associated with the minor fissure. Mild patchy tree-in-bud opacity in the far inferior left lower lobe. Subsegmental atelectasis in both lower lobes. No acute consolidative airspace disease, additional significant pulmonary nodules or lung masses.  Upper abdomen: Small to moderate hiatal hernia. Status post cholecystectomy. Simple 1.8 cm renal cyst in the anterior upper right kidney.  Musculoskeletal: No aggressive appearing focal osseous lesions. Severe S-shaped thoracolumbar scoliosis with bilateral  posterior thoracolumbar spine fusion hardware.  Review of the MIP images confirms the  above findings.  IMPRESSION: 1. No pulmonary embolism. 2. Mild patchy tree-in-bud opacity in the far inferior left lower lobe, most in keeping with a mild infectious bronchiolitis or mild aspiration. No acute consolidative airspace disease. 3. Solitary 5 mm right upper lobe pulmonary nodule. If the patient is at high risk for bronchogenic carcinoma, follow-up chest CT at 6-12 months is recommended. If the patient is at low risk for bronchogenic carcinoma, follow-up chest CT at 12 months is recommended. This recommendation follows the consensus statement: Guidelines for Management of Small Pulmonary Nodules Detected on CT Scans: A Statement from the Fleischner Society as published in Radiology 2005;237:395-400. 4. Left thyroid lobe 1.6 cm hypodense nodule. Recommend correlation with thyroid ultrasound on a short term outpatient basis. This follows ACR consensus guidelines: Managing Incidental Thyroid Nodules Detected on Imaging: White Paper of the ACR Incidental Thyroid Findings Committee. J Am Coll Radiol 2015; 12:143-150. 5. Small to moderate hiatal hernia.   Electronically Signed By: Delbert Phenix M.D. On: 10/29/2015 14:09 ____________________________________________   PROCEDURES  Procedure(s) performed: None   ____________________________________________   INITIAL IMPRESSION / ASSESSMENT AND PLAN / ED COURSE  Pertinent labs & imaging results that were available during my care of the patient were reviewed by me and considered in my medical decision making (see chart for details).  Patient was advised to call tomorrow to schedule a follow-up appointment with her primary care provider. She was made aware of the thyroid nodule and the need for outpatient ultrasound. She was also made aware of the pulmonary nodule and will discuss that with Dr. Sherryll Burger.  Prescription for Levaquin,  prednisone, and guaifenesin with codeine will be given. Patient also has oxycodone from her hip replacement and will take that as needed for pain. She was encouraged to return to the emergency department for symptoms that change or worsen if she is unable to see her primary care provider. ____________________________________________   FINAL CLINICAL IMPRESSION(S) / ED DIAGNOSES  Final diagnoses:  Thyroid nodule  Pulmonary nodule  Bronchitis  Cough       Chinita Pester, FNP 10/29/15 1438  Sharyn Creamer, MD 10/29/15 [phone]

## 2015-10-30 ENCOUNTER — Telehealth: Payer: Self-pay | Admitting: Family Medicine

## 2015-10-30 NOTE — Telephone Encounter (Signed)
Was seen this weekend at the ER for pain between her shoulder blade. They did chest xray and CT scan. It showed a nodule on the thyroid and chest. They are suggesting that you order a scan of the Thyroid. Patient is requesting a return call to discuss this with you.

## 2015-10-30 NOTE — Telephone Encounter (Signed)
Please schedule appt. To discuss the CT scan findings and order appropriate follow up studies.

## 2015-10-31 NOTE — Telephone Encounter (Signed)
Patient has appointment for 11/02/15

## 2015-11-02 ENCOUNTER — Encounter: Payer: Self-pay | Admitting: Family Medicine

## 2015-11-02 ENCOUNTER — Ambulatory Visit (INDEPENDENT_AMBULATORY_CARE_PROVIDER_SITE_OTHER): Payer: Commercial Managed Care - HMO | Admitting: Family Medicine

## 2015-11-02 VITALS — BP 110/68 | HR 71 | Temp 97.7°F | Resp 16 | Ht 62.0 in | Wt 150.0 lb

## 2015-11-02 DIAGNOSIS — R911 Solitary pulmonary nodule: Secondary | ICD-10-CM

## 2015-11-02 DIAGNOSIS — E041 Nontoxic single thyroid nodule: Secondary | ICD-10-CM

## 2015-11-02 DIAGNOSIS — Q6100 Congenital renal cyst, unspecified: Secondary | ICD-10-CM | POA: Diagnosis not present

## 2015-11-02 DIAGNOSIS — D649 Anemia, unspecified: Secondary | ICD-10-CM

## 2015-11-02 DIAGNOSIS — N281 Cyst of kidney, acquired: Secondary | ICD-10-CM | POA: Insufficient documentation

## 2015-11-02 NOTE — Progress Notes (Signed)
Name: [name]   MRN: [id number]    DOB: June 28, 1951   Date:11/02/2015       Progress Note  Subjective  Chief Complaint  Chief Complaint  Patient presents with  . Abnormal Lab    patient was recently at the ER and was told she needed to f/u with PCP for abnormal labs and images.    HPI  Pt. Is here for evaluation of left thyroid nodule seen on a CT scan of chest for bronchitis. It is 1.6 cm hypodense nodule. It was recommended to obtain a dedicated thyroid ultrasound for complete evaluation.  She has history of hypothyroidism, has been on Levothyroxine in the past (very small dose) and then discontinued it 3-4 years ago. No symptoms of over or underactive thyroid.  Patient also has a solitary 5 mm right upper lobe nodule, seen on CT scan of chest. No known history of smoking. She has history of anemia and has been followed by oncology in the past. We will repeat hemoglobin and hematocrit today  Past Medical History  Diagnosis Date  . Scoliosis   . Heart murmur     for years, not heard now  . Hypertension   . Headache(784.0)     non post menopausal  . Neuropathic bladder   . UTI (lower urinary tract infection)     Dut to neuropathic bladder  . History of blood transfusion     post back surgeries- '90's  . Complication of anesthesia   . PONV (postoperative nausea and vomiting)     Past Surgical History  Procedure Laterality Date  . Back surgery    . Anterior release vertebral body w/ posterior fusion  1991    2 surgeries   . Tonsillectomy  1973  . Seportinoplasty  1980  . Cholecystectomy  1991  . Anterior and posterior spinal fusion  1995    L5- S  . Dilation and curettage of uterus  2004  . Hysteroscopy  2004  . Elbow wedge osteotomy  2005    L4  . Hardware replaced  2005  . Hardware removal  2006    T5  . P/p fusion Right 2010    2nd, 3rd, 4th  . Orif metatarsal fracture Right 2011    1 and 2  . Tendon release Right 2011  . Ankle fusion Right 2013   tendon release right 2nd toe  . Bone biospy Right     ankle  . Joint replacement Right 2008    hip  . Revision total hip arthroplasty Right 2011  . Amputation Right 01/13/2014    Procedure: AMPUTATION BELOW RIGHT KNEE;  Surgeon: Toni Arthurs, MD;  Location: [facility] OR;  Service: Orthopedics;  Laterality: Right;  . Breast surgery Left 1990    biospy non cancer  . Total hip arthroplasty Left 08/02/2015    Procedure: TOTAL HIP ARTHROPLASTY;  Surgeon: Donato Heinz, MD;  Location: [facility] ORS;  Service: Orthopedics;  Laterality: Left;    Family History  Problem Relation Age of Onset  . Hypertension Mother   . Hypertension Father   . CAD Father   . Hypertension Son   . Obesity Sister   . Obesity Brother   . Hypertension Sister     Social History   Social History  . Marital Status: Married    Spouse Name: N/A  . Number of Children: N/A  . Years of Education: N/A   Occupational History  . Not on file.   Social History  Main Topics  . Smoking status: Never Smoker   . Smokeless tobacco: Never Used  . Alcohol Use: No  . Drug Use: No  . Sexual Activity: Yes   Other Topics Concern  . Not on file   Social History Narrative     Current outpatient prescriptions:  .  atenolol (TENORMIN) 25 MG tablet, Take 1 tablet (25 mg total) by mouth every morning., Disp: 90 tablet, Rfl: 0 .  conjugated estrogens (PREMARIN) vaginal cream, Pea sized amount to urethra 3 times weekly at bedtime., Disp: 42.5 g, Rfl: 12 .  Cranberry 1000 MG CAPS, Take 3,600 mg by mouth daily., Disp: , Rfl:  .  diphenhydramine-acetaminophen (TYLENOL PM) 25-500 MG TABS, Take 2 tablets by mouth at bedtime., Disp: , Rfl:  .  gabapentin (NEURONTIN) 300 MG capsule, Take 2 capsules (600 mg total) by mouth 3 (three) times daily., Disp: 540 capsule, Rfl: 1 .  guaiFENesin-codeine 100-10 MG/5ML syrup, Take 5 mLs by mouth 3 (three) times daily as needed., Disp: 120 mL, Rfl: 0 .  Incontinence Supply Disposable (BLADDER CONTROL PADS EX  ABSORB) MISC, , Disp: , Rfl:  .  levofloxacin (LEVAQUIN) 750 MG tablet, Take 1 tablet (750 mg total) by mouth daily., Disp: 7 tablet, Rfl: 0 .  senna (SENOKOT) 8.6 MG TABS tablet, Take 2 tablets by mouth daily., Disp: , Rfl:  .  triamterene-hydrochlorothiazide (DYAZIDE) 37.5-25 MG capsule, Take 1 each (1 capsule total) by mouth every morning., Disp: 90 capsule, Rfl: 0  Allergies  Allergen Reactions  . Dilaudid [Hydromorphone] Shortness Of Breath    Respiratory arrest  . Benzoin Rash  . Other Rash    "Old kind of Adhesive Tape".  . Nsaids     Other reaction(s): Kidney Disorder Renal failure  . Tape Rash     Review of Systems  Constitutional: Negative for weight loss and malaise/fatigue.  Gastrointestinal: Negative for constipation.  Skin: Negative for rash.    Objective  Filed Vitals:   11/02/15 1155  BP: 110/68  Pulse: 71  Temp: 97.7 F (36.5 C)  TempSrc: Oral  Resp: 16  Height: 5\' 2"  (1.575 m)  Weight: 150 lb (68.04 kg)  SpO2: 97%    Physical Exam  Constitutional: She is oriented to person, place, and time and well-developed, well-nourished, and in no distress.  Neck: Neck supple. No thyroid mass present.  Cardiovascular: Normal rate, regular rhythm and normal heart sounds.   Pulmonary/Chest: Effort normal and breath sounds normal.  Neurological: She is alert and oriented to person, place, and time.  Nursing note and vitals reviewed.   Assessment & Plan  1. Anemia, unspecified anemia type Obtain workup including hemoglobin, hematocrit and iron studies for evaluation of anemia. Patient has reportedly seen hematology in the past. Follow-up after review of lab work - CBC w/Diff/Platelet - Iron Binding Cap (TIBC) - Ferritin - Retic - Transferrin - Iron  2. Thyroid nodule We'll obtain thyroid function panel and an ultrasound for evaluation of nodule. - T3 Uptake - T4, free - TSH - US Soft Tissue Head/Neck; Future  3. Pulmonary nodule Repeat chest CT in  12 months for evaluation of solitary 5 mm RUL pulmonary nodule  4. Renal cyst Patient will follow-up with urology.   Calixto Pavel [name] [name] [facility] 11/02/2015 12:17 PM

## 2015-11-03 LAB — IRON AND TIBC
IRON SATURATION: 22 % (ref 15–55)
Iron: 69 ug/dL (ref 27–139)
Total Iron Binding Capacity: 318 ug/dL (ref 250–450)
UIBC: 249 ug/dL (ref 118–369)

## 2015-11-03 LAB — FERRITIN: FERRITIN: 106 ng/mL (ref 15–150)

## 2015-11-03 LAB — CBC WITH DIFFERENTIAL/PLATELET
BASOS: 0 %
Basophils Absolute: 0 10*3/uL (ref 0.0–0.2)
EOS (ABSOLUTE): 0 10*3/uL (ref 0.0–0.4)
Eos: 0 %
HEMATOCRIT: 35 % (ref 34.0–46.6)
HEMOGLOBIN: 11.3 g/dL (ref 11.1–15.9)
IMMATURE GRANS (ABS): 0 10*3/uL (ref 0.0–0.1)
Immature Granulocytes: 0 %
LYMPHS: 13 %
Lymphocytes Absolute: 0.9 10*3/uL (ref 0.7–3.1)
MCH: 29.3 pg (ref 26.6–33.0)
MCHC: 32.3 g/dL (ref 31.5–35.7)
MCV: 91 fL (ref 79–97)
MONOCYTES: 2 %
Monocytes Absolute: 0.1 10*3/uL (ref 0.1–0.9)
NEUTROS ABS: 6.1 10*3/uL (ref 1.4–7.0)
Neutrophils: 85 %
Platelets: 385 10*3/uL — ABNORMAL HIGH (ref 150–379)
RBC: 3.86 x10E6/uL (ref 3.77–5.28)
RDW: 14.2 % (ref 12.3–15.4)
WBC: 7.2 10*3/uL (ref 3.4–10.8)

## 2015-11-03 LAB — T4, FREE: Free T4: 1.18 ng/dL (ref 0.82–1.77)

## 2015-11-03 LAB — TSH: TSH: 1.27 u[IU]/mL (ref 0.450–4.500)

## 2015-11-03 LAB — RETICULOCYTES: Retic Ct Pct: 1 % (ref 0.6–2.6)

## 2015-11-03 LAB — TRANSFERRIN: TRANSFERRIN: 274 mg/dL (ref 200–370)

## 2015-11-03 LAB — T3 UPTAKE: T3 Uptake Ratio: 33 % (ref 24–39)

## 2015-11-08 ENCOUNTER — Ambulatory Visit
Admission: RE | Admit: 2015-11-08 | Discharge: 2015-11-08 | Disposition: A | Discharge status: Home / Self Care | Payer: Commercial Managed Care - HMO | Source: Ambulatory Visit | Attending: Family Medicine | Admitting: Family Medicine

## 2015-11-08 DIAGNOSIS — E041 Nontoxic single thyroid nodule: Secondary | ICD-10-CM | POA: Insufficient documentation

## 2015-11-09 ENCOUNTER — Ambulatory Visit: Payer: Medicare FFS

## 2015-11-19 ENCOUNTER — Other Ambulatory Visit: Payer: Self-pay | Admitting: Family Medicine

## 2015-11-19 DIAGNOSIS — E041 Nontoxic single thyroid nodule: Secondary | ICD-10-CM

## 2015-11-23 ENCOUNTER — Telehealth: Payer: Self-pay | Admitting: Family Medicine

## 2015-11-23 NOTE — Telephone Encounter (Signed)
Requesting refill on Atenolol 25mg  90 day supply. [facility]. Was seen earlier this month.

## 2015-11-24 NOTE — Telephone Encounter (Signed)
Refill has been filled for a 90-day supply to early for refilled patient

## 2015-11-28 ENCOUNTER — Telehealth: Payer: Self-pay | Admitting: Family Medicine

## 2015-11-28 DIAGNOSIS — G629 Polyneuropathy, unspecified: Secondary | ICD-10-CM

## 2015-11-28 NOTE — Telephone Encounter (Signed)
REFILL ON HER BLOOD PRESSURE MEDS,( SHE IS OUT OF BLOOD PRESSURE MEDS) , TRIAMTERENE/ HCTZ 37.5/25 1 EVERY AM, GABAPENTIN 300MG  2 THREE TIMES A DAY. NEEDS 90 DAY SUPPLY ON THESE. PHARM IS [facility]. SAYS THAT SHE WAS JUST HERE IN DEC AND HAD NOT BEEN DONEAND PHARM HAS SENT IN REQUEST AND HAS NOT HEARD ANYTHING.

## 2015-11-29 ENCOUNTER — Other Ambulatory Visit: Payer: Self-pay | Admitting: Family Medicine

## 2015-11-30 MED ORDER — GABAPENTIN 300 MG PO CAPS: 600.0000 mg | ORAL_CAPSULE | Freq: Three times a day (TID) | ORAL | Status: DC

## 2015-11-30 NOTE — Telephone Encounter (Signed)
Medication has been refilled and [facility]

## 2015-11-30 NOTE — Telephone Encounter (Signed)
Medication has been refilled and sent to [facility]

## 2016-01-18 ENCOUNTER — Telehealth: Payer: Self-pay

## 2016-01-18 NOTE — Telephone Encounter (Signed)
Pt requesting Referral for Dr. Ether Griffins, she has an appt on 01/22/16 ICD-10: M79.675 Auth # [id number] Start - 01/18/16 Expires - 07/16/16 6 visits

## 2016-01-29 ENCOUNTER — Other Ambulatory Visit: Payer: Self-pay | Admitting: Family Medicine

## 2016-01-30 NOTE — Telephone Encounter (Signed)
Medication has been refilled and sent to [facility] mail order

## 2016-02-06 ENCOUNTER — Ambulatory Visit (INDEPENDENT_AMBULATORY_CARE_PROVIDER_SITE_OTHER): Payer: Commercial Managed Care - HMO | Admitting: Family Medicine

## 2016-02-06 ENCOUNTER — Encounter: Payer: Self-pay | Admitting: Family Medicine

## 2016-02-06 ENCOUNTER — Telehealth: Payer: Self-pay

## 2016-02-06 VITALS — BP 110/64 | HR 76 | Temp 98.0°F | Resp 15 | Ht 62.0 in | Wt 160.5 lb

## 2016-02-06 DIAGNOSIS — Z0271 Encounter for disability determination: Secondary | ICD-10-CM | POA: Diagnosis not present

## 2016-02-06 NOTE — Telephone Encounter (Signed)
Pt called while she was in the office and asked me to do a [employer] Referral for her to see Dr. T[facility]John Hewitt [facility]. Authorization obtained.  Auth # [id number] Dr. Toni ArthursJohn Hewitt ICD-10 : T87.81 Start - 02/06/16 Expires - 08/04/16 6 visits

## 2016-02-07 DIAGNOSIS — Z0271 Encounter for disability determination: Secondary | ICD-10-CM | POA: Insufficient documentation

## 2016-02-07 NOTE — Progress Notes (Signed)
Name: [name]   MRN: [id number]    DOB: 11-Nov-1951   Date:02/07/2016       Progress Note  Subjective  Chief Complaint  Chief Complaint  Patient presents with  . Follow-up    Disability forms    HPI  Patient presents for completion of disability paperwork requested by her insurance company. She has reportedly been disabled for over 5 years, mainly secondary to neuropathy  Past Medical History  Diagnosis Date  . Scoliosis   . Heart murmur     for years, not heard now  . Hypertension   . Headache(784.0)     non post menopausal  . Neuropathic bladder   . UTI (lower urinary tract infection)     Dut to neuropathic bladder  . History of blood transfusion     post back surgeries- '90's  . Complication of anesthesia   . PONV (postoperative nausea and vomiting)     Past Surgical History  Procedure Laterality Date  . Back surgery    . Anterior release vertebral body w/ posterior fusion  1991    2 surgeries   . Tonsillectomy  1973  . Seportinoplasty  1980  . Cholecystectomy  1991  . Anterior and posterior spinal fusion  1995    L5- S  . Dilation and curettage of uterus  2004  . Hysteroscopy  2004  . Elbow wedge osteotomy  2005    L4  . Hardware replaced  2005  . Hardware removal  2006    T5  . P/p fusion Right 2010    2nd, 3rd, 4th  . Orif metatarsal fracture Right 2011    1 and 2  . Tendon release Right 2011  . Ankle fusion Right 2013    tendon release right 2nd toe  . Bone biospy Right     ankle  . Joint replacement Right 2008    hip  . Revision total hip arthroplasty Right 2011  . Amputation Right 01/13/2014    Procedure: AMPUTATION BELOW RIGHT KNEE;  Surgeon: Toni Arthurs, MD;  Location: [facility] OR;  Service: Orthopedics;  Laterality: Right;  . Breast surgery Left 1990    biospy non cancer  . Total hip arthroplasty Left 08/02/2015    Procedure: TOTAL HIP ARTHROPLASTY;  Surgeon: Donato Heinz, MD;  Location: [facility] ORS;  Service: Orthopedics;  Laterality: Left;     Family History  Problem Relation Age of Onset  . Hypertension Mother   . Hypertension Father   . CAD Father   . Hypertension Son   . Obesity Sister   . Obesity Brother   . Hypertension Sister     Social History   Social History  . Marital Status: Married    Spouse Name: N/A  . Number of Children: N/A  . Years of Education: N/A   Occupational History  . Not on file.   Social History Main Topics  . Smoking status: Never Smoker   . Smokeless tobacco: Never Used  . Alcohol Use: No  . Drug Use: No  . Sexual Activity: Yes   Other Topics Concern  . Not on file   Social History Narrative     Current outpatient prescriptions:  .  atenolol (TENORMIN) 25 MG tablet, Take 1 tablet (25 mg total) by mouth every morning., Disp: 90 tablet, Rfl: 0 .  conjugated estrogens (PREMARIN) vaginal cream, Pea sized amount to urethra 3 times weekly at bedtime., Disp: 42.5 g, Rfl: 12 .  diphenhydramine-acetaminophen (TYLENOL PM)  25-500 MG TABS, Take 2 tablets by mouth at bedtime., Disp: , Rfl:  .  gabapentin (NEURONTIN) 300 MG capsule, Take 2 capsules (600 mg total) by mouth 3 (three) times daily., Disp: 540 capsule, Rfl: 0 .  Incontinence Supply Disposable (BLADDER CONTROL PADS EX ABSORB) MISC, , Disp: , Rfl:  .  senna (SENOKOT) 8.6 MG TABS tablet, Take 2 tablets by mouth daily., Disp: , Rfl:  .  triamterene-hydrochlorothiazide (DYAZIDE) 37.5-25 MG capsule, Take 1 each (1 capsule total) by mouth every morning., Disp: 90 capsule, Rfl: 0  Allergies  Allergen Reactions  . Dilaudid [Hydromorphone] Shortness Of Breath    Respiratory arrest Respiratory arrest  . Benzoin Rash  . Other Rash    "Old kind of Adhesive Tape".  . Nsaids     Other reaction(s): Kidney Disorder Renal failure  . Tape Rash     ROS    Objective  Filed Vitals:   02/06/16 1505  BP: 110/64  Pulse: 76  Temp: 98 F (36.7 C)  TempSrc: Oral  Resp: 15  Height: 5\' 2"  (1.575 m)  Weight: 160 lb 8 oz (72.802  kg)  SpO2: 96%    Physical Exam  Constitutional: She is oriented to person, place, and time and well-developed, well-nourished, and in no distress.  Neurological: She is alert and oriented to person, place, and time.  Nursing note and vitals reviewed.    Assessment & Plan  1. Encounter for disability assessment I have not filled out any disability paperwork for this patient in the past. Advised to bring her medical records, including imaging, specialist notes, and other available supporting documentation. We'll review in detail and advise on the completion of disability form. Patient verbalized understanding.   Marchele Decock [name] [name] [facility] 02/07/2016 5:32 PM

## 2016-02-09 ENCOUNTER — Telehealth: Payer: Self-pay

## 2016-02-09 NOTE — Telephone Encounter (Signed)
Referral obtained for [facility] Meyer [facility]elBritto [facility], notified Aram BeechamCynthia at [facility] ICD-10: [id number] Start - 02/09/16 Expires - 08/07/16 6 visits

## 2016-02-12 ENCOUNTER — Ambulatory Visit: Admission: RE | Admit: 2016-02-12 | Payer: Medicare FFS | Source: Ambulatory Visit | Admitting: Surgery

## 2016-02-12 ENCOUNTER — Encounter: Payer: Commercial Managed Care - HMO | Attending: Surgery | Admitting: Surgery

## 2016-02-12 ENCOUNTER — Other Ambulatory Visit: Payer: Self-pay | Admitting: Surgery

## 2016-02-12 ENCOUNTER — Ambulatory Visit
Admission: RE | Admit: 2016-02-12 | Discharge: 2016-02-12 | Disposition: A | Discharge status: Home / Self Care | Payer: Medicare FFS | Source: Ambulatory Visit | Attending: Surgery | Admitting: Surgery

## 2016-02-12 DIAGNOSIS — N319 Neuromuscular dysfunction of bladder, unspecified: Secondary | ICD-10-CM | POA: Diagnosis not present

## 2016-02-12 DIAGNOSIS — G629 Polyneuropathy, unspecified: Secondary | ICD-10-CM | POA: Diagnosis not present

## 2016-02-12 DIAGNOSIS — S81801A Unspecified open wound, right lower leg, initial encounter: Secondary | ICD-10-CM

## 2016-02-12 DIAGNOSIS — M419 Scoliosis, unspecified: Secondary | ICD-10-CM | POA: Diagnosis not present

## 2016-02-12 DIAGNOSIS — I10 Essential (primary) hypertension: Secondary | ICD-10-CM | POA: Insufficient documentation

## 2016-02-12 DIAGNOSIS — R011 Cardiac murmur, unspecified: Secondary | ICD-10-CM | POA: Diagnosis not present

## 2016-02-12 DIAGNOSIS — R609 Edema, unspecified: Secondary | ICD-10-CM | POA: Insufficient documentation

## 2016-02-12 DIAGNOSIS — Z89511 Acquired absence of right leg below knee: Secondary | ICD-10-CM | POA: Diagnosis not present

## 2016-02-12 DIAGNOSIS — M7989 Other specified soft tissue disorders: Secondary | ICD-10-CM | POA: Diagnosis present

## 2016-02-12 DIAGNOSIS — L97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 fat layer exposed: Secondary | ICD-10-CM | POA: Diagnosis present

## 2016-02-12 DIAGNOSIS — M7138 Other bursal cyst, other site: Secondary | ICD-10-CM | POA: Diagnosis not present

## 2016-02-12 NOTE — Progress Notes (Addendum)
[name], [name] ([id number]) Visit Report for 02/12/2016 Chief Complaint Document Details Patient Name: Stephanie, [name]. Date of Service: 02/12/2016 2:15 PM Medical Record Number: [id number] Patient Account Number: [id number] Date of Birth/Sex: January 23, 1951 (65 y.o. Female) Treating RN: Ashok Cordia, Debi Primary Care Physician: [facility], [facility] Other Clinician: Referring Physician: Toni Arthurs Treating Physician/Extender: Rudene Re in Treatment: 0 Information Obtained from: Patient Chief Complaint Patient seen for complaints of Non-Healing Wound to the right below-knee amputation stump with a swelling around this area for about 3 weeks Electronic Signature(s) Signed: 02/12/2016 3:21:45 PM By: Evlyn Kanner MD, FACS Entered By: Evlyn Kanner on 02/12/2016 15:21:45 [name], [name] ([id number]) -------------------------------------------------------------------------------- Debridement Details Patient Name: [name]. Date of Service: 02/12/2016 2:15 PM Medical Record Number: [id number] Patient Account Number: [id number] Date of Birth/Sex: 1951-10-04 (65 y.o. Female) Treating RN: Ashok Cordia, Debi Primary Care Physician: [facility], [place] Other Clinician: Referring Physician: Toni Arthurs Treating Physician/Extender: Rudene Re in Treatment: 0 Debridement Performed for Wound #2 Right Amputation Site - Below Elbow Assessment: Performed By: Physician Evlyn Kanner, MD Debridement: Debridement Pre-procedure Yes Verification/Time Out Taken: Start Time: 15:04 Pain Control: Other : lidocaine 4% cream Level: Skin/Subcutaneous Tissue Total Area Debrided (L x 1 (cm) x 0.5 (cm) = 0.5 (cm) W): Tissue and other Viable, Non-Viable, Callus, Exudate, Fibrin/Slough, Subcutaneous material debrided: Instrument: Curette Bleeding: Minimum Hemostasis Achieved: Pressure End Time: 15:08 Procedural Pain: 0 Post Procedural Pain: 0 Response to Treatment: Procedure was tolerated well Post  Debridement Measurements of Total Wound Length: (cm) 1 Width: (cm) 0.7 Depth: (cm) 0.1 Volume: (cm) 0.055 Post Procedure Diagnosis Same as Pre-procedure Electronic Signature(s) Signed: 02/12/2016 3:21:14 PM By: Evlyn Kanner MD, FACS Signed: 02/12/2016 4:58:45 PM By: Alejandro Mulling Entered By: Evlyn Kanner on 02/12/2016 15:21:13 [name], [name] ([id number]) -------------------------------------------------------------------------------- HPI Details Patient Name: [name], [name] Date of Service: 02/12/2016 2:15 PM Medical Record Number: [id number] Patient Account Number: [id number] Date of Birth/Sex: 08-22-1951 (65 y.o. Female) Treating RN: Ashok Cordia, Debi Primary Care Physician: [facility], [facility] Other Clinician: Referring Physician: Toni Arthurs Treating Physician/Extender: Rudene Re in Treatment: 0 History of Present Illness Location: right below-knee area stump with swelling and ulceration Quality: Patient reports experiencing a dull pain to affected area(s). Severity: Patient states wound are getting worse. Duration: Patient has had the wound for < 3 weeks prior to presenting for treatment Timing: Pain in wound is Intermittent (comes and goes Context: The wound appeared gradually over time Modifying Factors: Other treatment(s) tried include: orthopedic office was advised to local care and wound center consult Associated Signs and Symptoms: Patient reports having increase swelling. HPI Description: 65 year old patient who was seen earlier about 2 years ago at our wound center for a nonhealing right BKA stump. She had a couple of attempts at salvage of her right foot with orthopedic related surgery but ultimately required her BKA done by Dr. Toni Arthurs in [place] In February 2015. During that period of time she had also received hyperbaric oxygen therapy. She saw Dr. Laverta Baltimore PA Mr. Alfredo Martinez, last week for a follow-up of her right below-knee amputation. She has had  a new wound with clear yellow drainage when she wears a processes. Past medical history significant for Charcot's joint of the right ankle, status post right BKA, was never a smoker. also is known to have scoliosis, heart murmur, hypertension, status post back surgeries, cholecystectomy, spinal fusion,hip replacement, right below-knee amputation, total left hip arthroplasty in September 2016. When the PA saw her there was a 1.5 cm linear  ulcer in the central aspect of the incision and there was no evidence of cellulitis or drainage. She was referred to [place] for aggressive treatment of her ulcer before it progresses and would result in an above-the-knee amputation. She was advised to discontinue use of a prosthesis until the ulcer heals Electronic Signature(s) Signed: 02/12/2016 3:23:14 PM By: Evlyn Kanner MD, FACS Previous Signature: 02/12/2016 2:24:49 PM Version By: Evlyn Kanner MD, FACS Previous Signature: 02/12/2016 2:16:40 PM Version By: Evlyn Kanner MD, FACS Entered By: Evlyn Kanner on 02/12/2016 15:23:13 [name] ([id number]) -------------------------------------------------------------------------------- Physical Exam Details Patient Name: NAYLEAH, GAMEL. Date of Service: 02/12/2016 2:15 PM Medical Record Number: [id number] Patient Account Number: [id number] Date of Birth/Sex: 30-Dec-1950 (65 y.o. Female) Treating RN: Phillis Haggis Primary Care Physician: [facility], [place] Other Clinician: Referring Physician: Toni Arthurs Treating Physician/Extender: Rudene Re in Treatment: 0 Constitutional . Pulse regular. Respirations normal and unlabored. Afebrile. . Eyes Nonicteric. Reactive to light. Ears, Nose, Mouth, and Throat Lips, teeth, and gums WNL.Marland Kitchen Moist mucosa without lesions. Neck supple and nontender. No palpable supraclavicular or cervical adenopathy. Normal sized without goiter. Respiratory WNL. No retractions.. Cardiovascular popliteal pulse is palpable behind  the right knee. No clubbing, cyanosis or edema. Chest . Breast tissue WNL, no masses, lumps, or tenderness.. Gastrointestinal (GI) Abdomen without masses or tenderness.. No liver or spleen enlargement or tenderness.. Lymphatic No adneopathy. No adenopathy. No adenopathy. Musculoskeletal Adexa without tenderness or enlargement.. Digits and nails w/o clubbing, cyanosis, infection, petechiae, ischemia, or inflammatory conditions.. Integumentary (Hair, Skin) No suspicious lesions. No crepitus or fluctuance. No peri-wound warmth or erythema. No masses.Marland Kitchen Psychiatric Judgement and insight Intact.. No evidence of depression, anxiety, or agitation.. Notes in the region of her right below-knee amputation scar she has a ulcerated region with surrounding callus which does not tunnel down and sharp dissection was done with a curette and the callus removed and the base of the ulcer freshened. There is no cellulitis or inflammation. She has a ballotable fluctuant swelling in the region of the anterior BKA stump which is possibly a bursa type of swelling. it is not tender nor is it inflamed. Electronic Signature(s) Signed: 02/12/2016 3:24:55 PM By: Evlyn Kanner MD, FACS Stephanie, [name] ([id number]) Entered By: Evlyn Kanner on 02/12/2016 15:24:54 [name] ([id number]) -------------------------------------------------------------------------------- Physician Orders Details Patient Name: Mcclurg, Kennedee F. Date of Service: 02/12/2016 2:15 PM Medical Record Number: [id number] Patient Account Number: [id number] Date of Birth/Sex: 1951/08/23 (65 y.o. Female) Treating RN: Ashok Cordia, Debi Primary Care Physician: [facility], [place] Other Clinician: Referring Physician: Toni Arthurs Treating Physician/Extender: Rudene Re in Treatment: 0 Verbal / Phone Orders: Yes Clinician: Pinkerton, Debi Read Back and Verified: Yes Diagnosis Coding Wound Cleansing Wound #2 Right Amputation Site - Below Elbow o  Clean wound with Normal Saline. Anesthetic Wound #2 Right Amputation Site - Below Elbow o Topical Lidocaine 4% cream applied to wound bed prior to debridement Skin Barriers/Peri-Wound Care Wound #2 Right Amputation Site - Below Elbow o Skin Prep Primary Wound Dressing Wound #2 Right Amputation Site - Below Elbow o Prisma Ag - reconstitute with normal saline Secondary Dressing Wound #2 Right Amputation Site - Below Elbow o Boardered Foam Dressing Dressing Change Frequency Wound #2 Right Amputation Site - Below Elbow o Change dressing every other day. Follow-up Appointments Wound #2 Right Amputation Site - Below Elbow o Return Appointment in 1 week. Radiology o X-ray, other - right BKA stump, and ultrasound of right calf / right BKA [employer] o ultrasound - Ultrasound  of right calf, right BKA Stephanie, [name] ([id number]) Electronic Signature(s) Signed: 02/12/2016 4:23:25 PM By: Evlyn Kanner MD, FACS Signed: 02/12/2016 4:58:45 PM By: Alejandro Mulling Entered By: Alejandro Mulling on 02/12/2016 15:27:48 Lubeck, [name] ([id number]) -------------------------------------------------------------------------------- Problem List Details Patient Name: Geise, Margart F. Date of Service: 02/12/2016 2:15 PM Medical Record Number: [id number] Patient Account Number: [id number] Date of Birth/Sex: 06-06-1951 (65 y.o. 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 Date of Service: 02/12/2016 2:15 PM Medical Record Number: [id number] Patient Account Number: [id number] Date of Birth/Sex: 1951/05/01 (65 y.o. Female) Treating RN: Ashok Cordia, Debi Primary Care Physician: [facility], [place] Other Clinician: Referring Physician: Toni Arthurs Treating Physician/Extender: Rudene Re in Treatment: 0 Subjective Chief Complaint Information obtained from Patient Patient seen for complaints of Non-Healing Wound to the right below-knee amputation stump with a swelling around this area for about 3 weeks History of Present Illness (HPI) The following HPI elements were documented for the patient's wound: Location: right below-knee area stump with swelling and ulceration Quality: Patient reports experiencing a dull pain to affected area(s). Severity: Patient states wound are getting worse. Duration: Patient has had the wound for < 3 weeks prior to presenting for treatment Timing: Pain in wound is Intermittent (comes and goes Context: The wound appeared gradually over time Modifying Factors: Other treatment(s) tried include: orthopedic office was advised to local care and wound center consult Associated Signs and Symptoms: Patient reports having increase swelling. 65 year old patient who was seen earlier about 2 years ago at our wound center for a nonhealing right BKA stump. She had a couple of attempts at salvage of her right foot with orthopedic related surgery but ultimately required her BKA done by Dr. Toni Arthurs in [place] In February 2015. During that period of time she had also received hyperbaric oxygen therapy. She saw Dr. Laverta Baltimore PA Mr. Alfredo Martinez, last week for a follow-up of her right below-knee amputation. She has had a  new wound with clear yellow drainage when she wears a processes. Past medical history significant for Charcot's joint of the right ankle, status post right BKA, was never a smoker. also is known to have scoliosis, heart murmur, hypertension, status post back surgeries, cholecystectomy, spinal fusion,hip replacement, right below-knee amputation, total left hip arthroplasty in September 2016. When the PA saw her there was a 1.5 cm linear ulcer in the central aspect of the incision and there was no evidence of cellulitis or drainage. She was referred to [place] for aggressive treatment of her ulcer before it progresses and would result in an above-the-knee amputation. She was advised to discontinue use of a prosthesis until the ulcer heals Wound History Patient presents with 1 open wound that has been present for approximately 2 weeks. Patient has been treating wound in the following manner: bag balm. Laboratory tests have not been performed in the last month. Patient reportedly has not tested positive for an antibiotic resistant organism. Patient reportedly has Stephanie, [name]. ([id number]) not  tested positive for osteomyelitis. Patient reportedly has not had testing performed to evaluate circulation in the legs. Patient experiences the following problems associated with their wounds: swelling. Patient History Information obtained from Patient. Allergies adhesive (Reaction: hives), Dilaudid (Reaction: stop breathing), benzoin (Severity: Severe, Reaction: hives) Family History Heart Disease - Father, Hypertension - Maternal Grandparents, Paternal Grandparents, Mother, Father, Siblings, Thyroid Problems - Siblings, No family history of Cancer, Diabetes, Kidney Disease, Lung Disease, Seizures, Stroke. Social History Never smoker, Marital Status - Married, Alcohol Use - Never, Drug Use - No History, Caffeine Use - Daily. Medical History Eyes Patient has history of Cataracts Hospitalization/Surgery  History - 01/13/2014, [facility], BKA right. - 08/25/2009, [facility], Hip Replacement. - 01/24/2012, [facility], right ankle fusion. Medical And Surgical History Notes Constitutional Symptoms (General Health) misc broken brokens bones in right lower leg no due to trauma, High Blood pressure, nueopathy, scoliosis, neurogenic bladder and bowel problems (diarhea and constipation), Right BKA due to ankle damage that was unfixable (multible broken/ brittle bones); Right Hip Replacement Endocrine Hypothroid medications taken in the past. Last labs normal without medications. Review of Systems (ROS) Constitutional Symptoms (General Health) The patient has no complaints or symptoms. Eyes Complains or has symptoms of Vision Changes - glasses. Ear/Nose/Mouth/Throat The patient has no complaints or symptoms. Hematologic/Lymphatic The patient has no complaints or symptoms. Respiratory The patient has no complaints or symptoms. Medications: I have reviewed her list of medications which include Neurontin, gabapentin, atenolol, triamterene hydrochlorothiazide, Tylenol and senna. Stephanie KetGATES, Laia F. ([id number]) Objective Constitutional Pulse regular. Respirations normal and unlabored. Afebrile. Vitals Time Taken: 2:18 PM, Height: 62 in, Source: Stated, Weight: 160 lbs, Source: Stated, BMI: 29.3, Temperature: 97.5 F, Pulse: 61 bpm, Respiratory Rate: 20 breaths/min, Blood Pressure: 112/63 mmHg. Eyes Nonicteric. Reactive to light. Ears, Nose, Mouth, and Throat Lips, teeth, and gums WNL.Marland Kitchen. Moist mucosa without lesions. Neck supple and nontender. No palpable supraclavicular or cervical adenopathy. Normal sized without goiter. Respiratory WNL. No retractions.. Cardiovascular popliteal pulse is palpable behind the right knee. No clubbing, cyanosis or edema. Chest Breast tissue WNL, no masses, lumps, or tenderness.. Gastrointestinal (GI) Abdomen without masses or tenderness.. No liver or spleen enlargement or  tenderness.. Lymphatic No adneopathy. No adenopathy. No adenopathy. Musculoskeletal Adexa without tenderness or enlargement.. Digits and nails w/o clubbing, cyanosis, infection, petechiae, ischemia, or inflammatory conditions.Marland Kitchen. Psychiatric Judgement and insight Intact.. No evidence of depression, anxiety, or agitation.. General Notes: in the region of her right below-knee amputation scar she has a ulcerated region with surrounding callus which does not tunnel down and sharp dissection was done with a curette and the callus removed and the base of the ulcer freshened. There is no cellulitis or inflammation. She has a ballotable [name], [name] ([id number]) fluctuant swelling in the region of the anterior BKA stump which is possibly a bursa type of swelling. it is not tender nor is it inflamed. Integumentary (Hair, Skin) No suspicious lesions. No crepitus or fluctuance. No peri-wound warmth or erythema. No masses.. Wound #2 status is Open. Original cause of wound was Blister. The wound is located on the Right Amputation Site - Below Elbow. The wound measures 1cm length x 0.5cm width x 0.1cm depth; 0.393cm^2 area and 0.039cm^3 volume. The wound is limited to skin breakdown. There is no tunneling or undermining noted. There is a large amount of serous drainage noted. The wound margin is flat and intact. There is large (67-100%) red, pink granulation within the wound bed. There is a small (1-33%) amount of necrotic tissue  within the wound bed including Adherent Slough. The periwound skin appearance exhibited: Moist. Periwound temperature was noted as No Abnormality. The periwound has tenderness on palpation. 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 She now has a pressure injury to the right BKA stump and has a significant bursa in the region of this region. This is anterior and possibly cause due to chronic pressure. I have recommended: 1. Prisma AG and a bordered foam to be changed every other day 2. Not wearing her prosthesis still this wound heals 3. Getting an x-ray of the BKA stump 4. Getting an ultrasound of the area which is ballotable and fluctuant. I have added a detailed discussion with her regarding offloading and appropriate dressing changes and have cautioned her against wearing a prosthesis for any length of time. She will be back to see me next week. I also urged her to have a return appointment with Dr. Victorino Dike especially regarding the bursa and his opinion regarding what she should do about this. Stephanie, [name] ([id number]) Procedures Wound #2 Wound #2 is a To be determined located on the Right Amputation Site - Below Elbow . There was a Skin/Subcutaneous Tissue Debridement ([id number]) debridement with total area of 0.5 sq cm performed by Evlyn Kanner, MD. with the following instrument(s): Curette to remove Viable and Non-Viable tissue/material including Exudate, Fibrin/Slough, Callus, and Subcutaneous after achieving pain control using Other (lidocaine 4% cream). A time out was conducted prior to the start of the procedure. A Minimum amount of bleeding was controlled with Pressure. The procedure was tolerated well with a pain level of 0 throughout and a pain level of 0 following the procedure. Post Debridement Measurements: 1cm length x 0.7cm width x 0.1cm depth; 0.055cm^3 volume. Post procedure Diagnosis Wound #2: Same as Pre-Procedure Plan Wound Cleansing: Wound #2 Right Amputation Site - Below Elbow: Clean wound with Normal Saline. Anesthetic: Wound #2 Right Amputation Site - Below Elbow: Topical Lidocaine 4% cream applied to wound bed prior to debridement Skin Barriers/Peri-Wound Care: Wound  #2 Right Amputation Site - Below Elbow: Skin Prep Primary Wound Dressing: Wound #2 Right Amputation Site - Below Elbow: Prisma Ag - reconstitute with normal saline Secondary Dressing: Wound #2 Right Amputation Site - Below Elbow: Dry Gauze Boardered Foam Dressing Dressing Change Frequency: Wound #2 Right Amputation Site - Below Elbow: Change dressing every other day. Follow-up Appointments: Wound #2 Right Amputation Site - Below Elbow: Return Appointment in 1 week. 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 She now has a pressure injury to the right BKA stump and has a significant bursa in the region of this region. This is anterior and possibly cause due to chronic pressure. I have recommended: 1. Prisma AG and a bordered foam to be changed every other day 2. Not wearing her prosthesis still this wound heals 3. Getting an x-ray of the BKA stump 4. Getting an ultrasound of the area which is ballotable and fluctuant. I have added a detailed discussion with her regarding offloading and appropriate dressing changes and  have cautioned her against wearing a prosthesis for any length of time. She will be back to see me next week. I also urged her to have a return appointment with Dr. Victorino Dike especially regarding the bursa and his opinion regarding what she should do about this. Electronic Signature(s) Signed: 02/12/2016 4:26:28 PM By: Evlyn Kanner MD, FACS Previous Signature: 02/12/2016 3:28:27 PM Version By: Evlyn Kanner MD, FACS Entered By: Evlyn Kanner on 02/12/2016 16:10:96 [name] ([id number]) -------------------------------------------------------------------------------- ROS/PFSH Details Patient Name: [name]. Date of  Service: 02/12/2016 2:15 PM Medical Record Number: [id number] Patient Account Number: [id number] Date of Birth/Sex: 1951/11/20 (65 y.o. Female) Treating RN: Ashok Cordia, Debi Primary Care Physician: [facility], [place] Other Clinician: Referring Physician: Toni Arthurs Treating Physician/Extender: Rudene Re in Treatment: 0 Information Obtained From Patient Wound History Do you currently have one or more open woundso Yes How many open wounds do you currently haveo 1 Approximately how long have you had your woundso 2 weeks How have you been treating your wound(s) until nowo bag balm Has your wound(s) ever healed and then re-openedo No Have you had any lab work done in the past montho No Have you tested positive for an antibiotic resistant organism (MRSA, VRE)o No Have you tested positive for osteomyelitis (bone infection)o No Have you had any tests for circulation on your legso No Have you had other problems associated with your woundso Swelling Eyes Complaints and Symptoms: Positive for: Vision Changes - glasses Medical History: Positive for: Cataracts Constitutional Symptoms (General Health) Complaints and Symptoms: No Complaints or Symptoms Medical History: Past Medical History Notes: misc broken brokens bones in right lower leg no due to trauma, High Blood pressure, nueopathy, scoliosis, neurogenic bladder and bowel problems (diarhea and constipation), Right BKA due to ankle damage that was unfixable (multible broken/ brittle bones); Right Hip Replacement Ear/Nose/Mouth/Throat Complaints and Symptoms: No Complaints or Symptoms Hematologic/Lymphatic Stephanie, [name] ([id number]) Complaints and Symptoms: No Complaints or Symptoms Respiratory Complaints and Symptoms: No Complaints or Symptoms Cardiovascular Medical History: Positive for: Hypertension Endocrine Medical History: Negative for: Type I Diabetes; Type II Diabetes Past Medical History Notes: Hypothroid  medications taken in the past. Last labs normal without medications. Neurologic Medical History: Positive for: Neuropathy - peineal down back of right leg/ little in left leg HBO Extended History Items Eyes: Cataracts Hospitalization / Surgery History Name of Hospital Purpose of Hospitalization/Surgery Date [facility] BKA right 01/13/2014 [facility] Hip Replacement 08/25/2009 [facility] right ankle fusion 01/24/2012 Family and Social History Cancer: No; Diabetes: No; Heart Disease: Yes - Father; Hypertension: Yes - Maternal Grandparents, Paternal Grandparents, Mother, Father, Siblings; Kidney Disease: No; Lung Disease: No; Seizures: No; Stroke: No; Thyroid Problems: Yes - Siblings; Never smoker; Marital Status - Married; Alcohol Use: Never; Drug Use: No History; Caffeine Use: Daily; Financial Concerns: No; Food, Clothing or Shelter Needs: No; Support System Lacking: No; Transportation Concerns: No; Living Will: Yes (Not Provided); Medical Power of Attorney: Yes (Not Provided) Physician Affirmation I have reviewed and agree with the above information. Electronic Signature(s) Signed: 02/12/2016 2:55:26 PM By: Evlyn Kanner MD, FACS Signed: 02/12/2016 4:58:45 PM By: Alejandro Mulling Entered By: Evlyn Kanner on 02/12/2016 14:55:24 Wise, [name] ([id number]) Roorda, [name] ([id number]) -------------------------------------------------------------------------------- SuperBill Details Patient Name: [name], [name] Date of Service: 02/12/2016 Medical Record Number: [id number] Patient Account Number: [id number] Date of Birth/Sex: 03/27/1951 (65 y.o. 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 fat M71.38 Other bursal cyst, other site Modifier: layer exposed Quantity: 1 Physician Procedures CPT4 Code: 5409811 Description: 99204 - WC PHYS LEVEL 4 - NEW PT ICD-10 Description Diagnosis Z89.511 Acquired absence of right leg below knee L97.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 fat M71.38 Other bursal cyst, other site Modifier: layer exposed Quantity: 1 CPT4 Code: 9147829 Description: 11042 - WC PHYS SUBQ TISS 20 SQ CM ICD-10 Description Diagnosis 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

## 2016-02-13 ENCOUNTER — Other Ambulatory Visit: Payer: Self-pay | Admitting: Surgery

## 2016-02-13 DIAGNOSIS — R2241 Localized swelling, mass and lump, right lower limb: Secondary | ICD-10-CM

## 2016-02-13 NOTE — Progress Notes (Signed)
[name], [name] ([id number]) Visit Report for 02/12/2016 Allergy List Details Patient Name: [name], [name] Date of Service: 02/12/2016 2:15 PM Medical Record Number: [id number] Patient Account Number: [id number] Date of Birth/Sex: [birth year] (65 y.o. Female) Treating RN: Ashok CordiaPinkerton, Debi [facility] Other Clinician: Referring Physician: Toni ArthursHEWITT, JOHN Treating Physician/Extender: Rudene ReBritto, Errol Weeks in Treatment: 0 Allergies Active Allergies adhesive Reaction: hives Dilaudid Reaction: stop breathing benzoin Reaction: hives Severity: Severe Allergy Notes Electronic Signature(s) Signed: 02/12/2016 4:58:45 PM By: Alejandro MullingPinkerton, Debra Entered By: Alejandro MullingPinkerton, Debra on 02/12/2016 14:21:04 Hamm, [name] ([id number]) -------------------------------------------------------------------------------- Arrival Information Details Patient Name: [name], [name] Date of Service: 02/12/2016 2:15 PM Medical Record Number: [id number] Patient Account Number: [id number] Date of Birth/Sex: [birth year] ([age] y.o. Female) Treating RN: Ashok CordiaPinkerton, Debi [facility] Other Clinician: Referring Physician: Toni ArthursHEWITT, JOHN Treating Physician/Extender: Rudene ReBritto, Errol Weeks in Treatment: 0 Visit Information Patient Arrived: Crutches Arrival Time: 14:15 Accompanied By: friend Transfer Assistance: EasyPivot Patient Lift Patient Identification Verified: Yes Secondary Verification Process Yes Completed: Patient Requires Transmission- No Based Precautions: Patient Has Alerts: No History Since Last Visit Electronic Signature(s) Signed: 02/12/2016 4:58:45 PM By: Alejandro MullingPinkerton, Debra Entered By: Alejandro MullingPinkerton, Debra on 02/12/2016 14:18:02 [name], [name] ([id number]) -------------------------------------------------------------------------------- Clinic Level of Care Assessment Details Patient Name: [name], [name] Date of Service: 02/12/2016 2:15 PM Medical Record Number:  [id number] Patient Account Number: [id number] Date of Birth/Sex: [birth year] (65 y.o. Female) Treating RN: Ashok CordiaPinkerton, Debi [facility] Other Clinician: Referring Physician: Toni ArthursHEWITT, JOHN Treating Physician/Extender: Rudene ReBritto, Errol Weeks in Treatment: 0 Clinic Level of Care Assessment Items TOOL 1 Quantity Score X - Use when EandM and Procedure is performed on INITIAL visit 1 0 ASSESSMENTS - Nursing Assessment / Reassessment X - General Physical Exam (combine w/ comprehensive assessment (listed just 1 20 below) when performed on new pt. evals) X - Comprehensive Assessment (HX, ROS, Risk Assessments, Wounds Hx, etc.) 1 25 ASSESSMENTS - Wound and Skin Assessment / Reassessment []  - Dermatologic / Skin Assessment (not related to wound area) 0 ASSESSMENTS - Ostomy and/or Continence Assessment and Care []  - Incontinence Assessment and Management 0 []  - Ostomy Care Assessment and Management (repouching, etc.) 0 PROCESS - Coordination of Care []  - Simple Patient / Family Education for ongoing care 0 X - Complex (extensive) Patient / Family Education for ongoing care 1 20 []  - Staff obtains ChiropractorConsents, Records, Test Results / Process Orders 0 []  - Staff telephones HHA, Nursing Homes / Clarify orders / etc 0 []  - Routine Transfer to another Facility (non-emergent condition) 0 []  - Routine Hospital Admission (non-emergent condition) 0 X - New Admissions / Manufacturing engineernsurance Authorizations / Ordering NPWT, Apligraf, etc. 1 15 []  - Emergency Hospital Admission (emergent condition) 0 PROCESS - Special Needs []  - Pediatric / Minor Patient Management 0 []  - Isolation Patient Management 0 [name], [name] ([id number]) []  - Hearing / Language / Visual special needs 0 []  - Assessment of Community assistance (transportation, D/C planning, etc.) 0 []  - Additional assistance / Altered mentation 0 []  - Support Surface(s) Assessment (bed, cushion, seat, etc.) 0 INTERVENTIONS - Miscellaneous []  -  External ear exam 0 []  - Patient Transfer (multiple staff / Nurse, adultHoyer Lift / Similar devices) 0 []  - Simple Staple / Suture removal (25 or less) 0 []  - Complex Staple / Suture removal (26 or more) 0 []  - Hypo/Hyperglycemic Management (do not check if billed separately) 0 []  - Ankle / Brachial Index (ABI) - do not check if billed separately 0  Has the patient been seen at the hospital within the last three years: Yes Total Score: 80 Level Of Care: New/Established - Level 3 Electronic Signature(s) Signed: 02/12/2016 4:58:45 PM By: Alejandro Mulling Entered By: Alejandro Mulling on 02/12/2016 16:10:43 [name], [name] ([id number]) -------------------------------------------------------------------------------- Encounter Discharge Information Details Patient Name: Stephanie Ket. Date of Service: 02/12/2016 2:15 PM Medical Record Number: [id number] Patient Account Number: [id number] Date of Birth/Sex: 09/29/1951 (65 y.o. Female) Treating RN: Ashok Cordia, Debi Primary Care Physician: [facility], [facility] Other Clinician: Referring Physician: Toni Arthurs Treating Physician/Extender: Rudene Re in Treatment: 0 Encounter Discharge Information Items Discharge Pain Level: 0 Discharge Condition: Stable Ambulatory Status: Crutches Discharge Destination: Home Private Transportation: Auto Accompanied By: friend Schedule Follow-up Appointment: Yes Medication Reconciliation completed and Yes provided to Patient/Care Karsin Pesta: Clinical Summary of Care: Electronic Signature(s) Signed: 02/12/2016 4:58:45 PM By: Alejandro Mulling Entered By: Alejandro Mulling on 02/12/2016 14:43:58 [name], [name] ([id number]) -------------------------------------------------------------------------------- Lower Extremity Assessment Details Patient Name: [name], [name] Date of Service: 02/12/2016 2:15 PM Medical Record Number: [id number] Patient Account Number: [id number] Date of Birth/Sex: [birth year] (65 y.o.  Female) Treating RN: Ashok Cordia, Debi Primary Care Physician: [facility], Kathi Ludwig Other Clinician: Referring Physician: Toni Arthurs Treating Physician/Extender: Rudene Re in Treatment: 0 Electronic Signature(s) Signed: 02/12/2016 4:58:45 PM By: Alejandro Mulling Entered By: Alejandro Mulling on 02/12/2016 14:19:31 [name], [name] ([id number]) -------------------------------------------------------------------------------- Multi Wound Chart Details Patient Name: Stephanie Ket. Date of Service: 02/12/2016 2:15 PM Medical Record Number: [id number] Patient Account Number: [id number] Date of Birth/Sex: 10-09-1951 (65 y.o. Female) Treating RN: Ashok Cordia, Debi Primary Care Physician: [facility], [facility] Other Clinician: Referring Physician: Toni Arthurs Treating Physician/Extender: Rudene Re in Treatment: 0 Vital Signs Height(in): 62 Pulse(bpm): 61 Weight(lbs): 160 Blood Pressure 112/63 (mmHg): Body Mass Index(BMI): 29 Temperature(F): 97.5 Respiratory Rate 20 (breaths/min): Photos: [2:No Photos] [N/A:N/A] Wound Location: [2:Right Amputation Site - Below Elbow] [N/A:N/A] Wounding Event: [2:Blister] [N/A:N/A] Primary Etiology: [2:To be determined] [N/A:N/A] Comorbid History: [2:Cataracts, Hypertension, Neuropathy] [N/A:N/A] Date Acquired: [2:01/29/2016] [N/A:N/A] Weeks of Treatment: [2:0] [N/A:N/A] Wound Status: [2:Open] [N/A:N/A] Measurements L x W x D 1x0.5x0.1 [N/A:N/A] (cm) Area (cm) : [2:0.393] [N/A:N/A] Volume (cm) : [2:0.039] [N/A:N/A] Classification: [2:Partial Thickness] [N/A:N/A] Exudate Amount: [2:Medium] [N/A:N/A] Exudate Type: [2:Serous] [N/A:N/A] Exudate Color: [2:amber] [N/A:N/A] Wound Margin: [2:Flat and Intact] [N/A:N/A] Granulation Amount: [2:Large (67-100%)] [N/A:N/A] Granulation Quality: [2:Red, Pink] [N/A:N/A] Necrotic Amount: [2:Small (1-33%)] [N/A:N/A] Exposed Structures: [2:Fascia: No Fat: No Tendon: No Muscle: No Joint: No Bone: No Limited to  Skin Breakdown] [N/A:N/A] Epithelialization: None N/A N/A Periwound Skin Texture: No Abnormalities Noted N/A N/A Periwound Skin Moist: Yes N/A N/A Moisture: Periwound Skin Color: No Abnormalities Noted N/A N/A Temperature: No Abnormality N/A N/A Tenderness on Yes N/A N/A Palpation: Wound Preparation: Ulcer Cleansing: N/A N/A Rinsed/Irrigated with Saline Topical Anesthetic Applied: Other: lidocaine 4% cream Treatment Notes Electronic Signature(s) Signed: 02/12/2016 4:58:45 PM By: Alejandro Mulling Entered By: Alejandro Mulling on 02/12/2016 14:40:33 Bo, [name] ([id number]) -------------------------------------------------------------------------------- Multi-Disciplinary Care Plan Details Patient Name: Stephanie Ket. Date of Service: 02/12/2016 2:15 PM Medical Record Number: [id number] Patient Account Number: [id number] Date of Birth/Sex: January 25, 1951 (65 y.o. Female) Treating RN: Ashok Cordia, Debi Primary Care Physician: [facility], [facility]ED Other Clinician: Referring Physician: Toni Arthurs Treating Physician/Extender: Rudene Re in Treatment: 0 Active Inactive Abuse / Safety / Falls / Self Care Management Nursing Diagnoses: Potential for falls Goals: Patient will remain injury free Date Initiated: 02/12/2016 Goal Status: Active Interventions: Assess fall risk on admission and as needed Notes: Orientation to the Wound Care  Program Nursing Diagnoses: Knowledge deficit related to the wound healing center program Goals: Patient/caregiver will verbalize understanding of the Wound Healing Center Program Date Initiated: 02/12/2016 Goal Status: Active Interventions: Provide education on orientation to the wound center Notes: Pain, Acute or Chronic Nursing Diagnoses: Pain, acute or chronic: actual or potential Potential alteration in comfort, pain Goals: GERALD, HONEA ([id number]) Patient will verbalize adequate pain control and receive pain control interventions during  procedures as needed Date Initiated: 02/12/2016 Goal Status: Active Patient/caregiver will verbalize adequate pain control between visits Date Initiated: 02/12/2016 Goal Status: Active Interventions: Assess comfort goal upon admission Complete pain assessment as per visit requirements Notes: Wound/Skin Impairment Nursing Diagnoses: Impaired tissue integrity Goals: Ulcer/skin breakdown will have a volume reduction of 30% by week 4 Date Initiated: 02/12/2016 Goal Status: Active Ulcer/skin breakdown will have a volume reduction of 50% by week 8 Date Initiated: 02/12/2016 Goal Status: Active Ulcer/skin breakdown will have a volume reduction of 80% by week 12 Date Initiated: 02/12/2016 Goal Status: Active Interventions: Assess patient/caregiver ability to obtain necessary supplies Assess ulceration(s) every visit Notes: Electronic Signature(s) Signed: 02/12/2016 4:58:45 PM By: Alejandro Mulling Entered By: Alejandro Mulling on 02/12/2016 16:09:54 [name], [name] ([id number]) -------------------------------------------------------------------------------- Pain Assessment Details Patient Name: Stephanie Ket. Date of Service: 02/12/2016 2:15 PM Medical Record Number: [id number] Patient Account Number: [id number] Date of Birth/Sex: 1951/04/05 (65 y.o. Female) Treating RN: Ashok Cordia, Debi Primary Care Physician: [facility], [facility]ED Other Clinician: Referring Physician: Toni Arthurs Treating Physician/Extender: Rudene Re in Treatment: 0 Active Problems Location of Pain Severity and Description of Pain Patient Has Paino No Site Locations Pain Management and Medication Current Pain Management: Electronic Signature(s) Signed: 02/12/2016 4:58:45 PM By: Alejandro Mulling Entered By: Alejandro Mulling on 02/12/2016 14:18:09 [name], [name] ([id number]) -------------------------------------------------------------------------------- Patient/Caregiver Education Details Patient Name: Stephanie Ket. Date of Service: 02/12/2016 2:15 PM Medical Record Number: [id number] Patient Account Number: [id number] Date of Birth/Gender: [birth year] (65 y.o. Female) Treating RN: Ashok Cordia, Debi Primary Care Physician: [facility], SYED Other Clinician: Referring Physician: Toni Arthurs Treating Physician/Extender: Rudene Re in Treatment: 0 Education Assessment Education Provided To: Patient Education Topics Provided Wound/Skin Impairment: Handouts: Other: change dressing as ordered Methods: Demonstration, Explain/Verbal Responses: State content correctly Electronic Signature(s) Signed: 02/12/2016 4:58:45 PM By: Alejandro Mulling Entered By: Alejandro Mulling on 02/12/2016 14:44:14 Obryan, [name] ([id number]) -------------------------------------------------------------------------------- Wound Assessment Details Patient Name: Stephanie Ket. Date of Service: 02/12/2016 2:15 PM Medical Record Number: [id number] Patient Account Number: [id number] Date of Birth/Sex: [birth year] (65 y.o. Female) Treating RN: Phillis Haggis Primary Care Physician: [facility], SYED Other Clinician: Referring Physician: Toni Arthurs Treating Physician/Extender: Rudene Re in Treatment: 0 Wound Status Wound Number: 2 Primary Etiology: To be determined Wound Location: Right Amputation Site - Below Wound Status: Open Elbow Comorbid Cataracts, Hypertension, Wounding Event: Blister History: Neuropathy Date Acquired: 01/29/2016 Weeks Of Treatment: 0 Clustered Wound: No Photos Photo Uploaded By: Alejandro Mulling on 02/12/2016 16:41:13 Wound Measurements Length: (cm) 1 Width: (cm) 0.5 Depth: (cm) 0.1 Area: (cm) 0.393 Volume: (cm) 0.039 % Reduction in Area: 0% % Reduction in Volume: 0% Epithelialization: None Tunneling: No Undermining: No Wound Description Classification: Partial Thickness Wound Margin: Flat and Intact Exudate Amount: Large Exudate Type: Serous Exudate Color:  amber Wound Bed Granulation Amount: Large (67-100%) Exposed Structure Granulation Quality: Red, Pink Fascia Exposed: No Necrotic Amount: Small (1-33%) Fat Layer Exposed: No Sinopoli, [name] ([id number]) Necrotic Quality: Adherent Slough Tendon Exposed: No Muscle Exposed: No Joint Exposed: No Bone Exposed: No Limited to  Skin Breakdown Periwound Skin Texture Texture Color No Abnormalities Noted: No No Abnormalities Noted: No Moisture Temperature / Pain No Abnormalities Noted: No Temperature: No Abnormality Moist: Yes Tenderness on Palpation: Yes Wound Preparation Ulcer Cleansing: Rinsed/Irrigated with Saline Topical Anesthetic Applied: Other: lidocaine 4% cream, Treatment Notes Wound #2 (Right Amputation Site - Below Elbow) 1. Cleansed with: Clean wound with Normal Saline 2. Anesthetic Topical Lidocaine 4% cream to wound bed prior to debridement 3. Peri-wound Care: Skin Prep 4. Dressing Applied: Prisma Ag 5. Secondary Dressing Applied Bordered Foam Dressing Dry Gauze Electronic Signature(s) Signed: 02/12/2016 4:58:45 PM By: Alejandro Mulling Entered By: Alejandro Mulling on 02/12/2016 16:13:54 Callens, [name] ([id number]) -------------------------------------------------------------------------------- Vitals Details Patient Name: Stephanie Ket. Date of Service: 02/12/2016 2:15 PM Medical Record Number: [id number] Patient Account Number: [id number] Date of Birth/Sex: 13-Nov-1951 (65 y.o. Female) Treating RN: Ashok Cordia, Debi Primary Care Physician: [facility], SYED Other Clinician: Referring Physician: Toni Arthurs Treating Physician/Extender: Rudene Re in Treatment: 0 Vital Signs Time Taken: 14:18 Temperature (F): 97.5 Height (in): 62 Pulse (bpm): 61 Source: Stated Respiratory Rate (breaths/min): 20 Weight (lbs): 160 Blood Pressure (mmHg): 112/63 Source: Stated Reference Range: 80 - 120 mg / dl Body Mass Index (BMI): 29.3 Electronic Signature(s) Signed:  02/12/2016 4:58:45 PM By: Alejandro Mulling Entered By: Alejandro Mulling on 02/12/2016 14:32:28

## 2016-02-13 NOTE — Progress Notes (Signed)
[name], [name] ([id number]) Visit Report for 02/12/2016 Abuse/Suicide Risk Screen Details Patient Name: [name], [name] Date of Service: 02/12/2016 2:15 PM Medical Record Number: [id number] Patient Account Number: [id number] Date of Birth/Sex: [birth year] (65 y.o. Female) Treating RN: Ashok CordiaPinkerton, Debi [facility] Other Clinician: Referring Physician: Toni ArthursHEWITT, JOHN Treating Physician/Extender: Rudene ReBritto, Errol Weeks in Treatment: 0 Abuse/Suicide Risk Screen Items Answer ABUSE/SUICIDE RISK SCREEN: Has anyone close to you tried to hurt or harm you recentlyo No Do you feel uncomfortable with anyone in your familyo No Has anyone forced you do things that you didnot want to doo No Do you have any thoughts of harming yourselfo No Patient displays signs or symptoms of abuse and/or neglect. No Electronic Signature(s) Signed: 02/12/2016 4:58:45 PM By: Alejandro MullingPinkerton, Debra Entered By: Alejandro MullingPinkerton, Debra on 02/12/2016 14:22:57 Agudelo, [name] ([id number]) -------------------------------------------------------------------------------- Activities of Daily Living Details Patient Name: [name], [name] Date of Service: 02/12/2016 2:15 PM Medical Record Number: [id number] Patient Account Number: [id number] Date of Birth/Sex: [birth year] (65 y.o. Female) Treating RN: Ashok CordiaPinkerton, Debi [facility] Other Clinician: Referring Physician: Toni ArthursHEWITT, JOHN Treating Physician/Extender: Rudene ReBritto, Errol Weeks in Treatment: 0 Activities of Daily Living Items Answer Activities of Daily Living (Please select one for each item) Drive Automobile Completely Able Take Medications Completely Able Use Telephone Completely Able Care for Appearance Completely Able Use Toilet Completely Able Bath / Shower Completely Able Dress Self Completely Able Feed Self Completely Able Walk Need Assistance Get In / Out Bed Completely Able Housework Completely Able Prepare Meals Completely Able Handle  Money Completely Able Shop for Self Completely Able Electronic Signature(s) Signed: 02/12/2016 4:58:45 PM By: Alejandro MullingPinkerton, Debra Entered By: Alejandro MullingPinkerton, Debra on 02/12/2016 14:24:14 [name], [name] ([id number]) -------------------------------------------------------------------------------- Education Assessment Details Patient Name: [name], [name] Date of Service: 02/12/2016 2:15 PM Medical Record Number: [id number] Patient Account Number: [id number] Date of Birth/Sex: [birth year] (65 y.o. Female) Treating RN: Ashok CordiaPinkerton, Debi [facility] Other Clinician: Referring Physician: Toni ArthursHEWITT, JOHN Treating Physician/Extender: Rudene ReBritto, Errol Weeks in Treatment: 0 Primary Learner Assessed: Patient Learning Preferences/Education Level/Primary Language Learning Preference: Explanation, Printed Material Highest Education Level: College or Above Preferred Language: English Cognitive Barrier Assessment/Beliefs Language Barrier: No Translator Needed: No Memory Deficit: No Emotional Barrier: No Cultural/Religious Beliefs Affecting Medical No Care: Physical Barrier Assessment Impaired Vision: Yes Glasses Impaired Hearing: No Decreased Hand dexterity: No Knowledge/Comprehension Assessment Knowledge Level: High Comprehension Level: High Ability to understand written High instructions: Ability to understand verbal High instructions: Motivation Assessment Anxiety Level: Calm Cooperation: Cooperative Education Importance: Acknowledges Need Interest in Health Problems: Asks Questions Perception: Coherent Willingness to Engage in Self- High Management Activities: Readiness to Engage in Self- High Management Activities: Electronic Signature(s) [name], [name] ([id number]) Signed: 02/12/2016 4:58:45 PM By: Alejandro MullingPinkerton, Debra Entered By: Alejandro MullingPinkerton, Debra on 02/12/2016 14:24:57 [name], [name]  ([id number]) -------------------------------------------------------------------------------- Fall Risk Assessment Details Patient Name: [name], [name] Date of Service: 02/12/2016 2:15 PM Medical Record Number: [id number] Patient Account Number: [id number] Date of Birth/Sex: [birth year] (65 y.o. Female) Treating RN: Ashok CordiaPinkerton, Debi [facility] Other Clinician: Referring Physician: Toni ArthursHEWITT, JOHN Treating Physician/Extender: Rudene ReBritto, Errol Weeks in Treatment: 0 Fall Risk Assessment Items Have you had 2 or more falls in the last 12 monthso 0 No Have you had any fall that resulted in injury in the last 12 monthso 0 No FALL RISK ASSESSMENT: History of falling - immediate or within 3 months 0 No Secondary diagnosis 15 Yes Ambulatory aid None/bed rest/wheelchair/nurse 0 No Crutches/cane/walker 15  Yes Furniture 0 No IV Access/Saline Lock 0 No Gait/Training Normal/bed rest/immobile 0 No Weak 0 No Impaired 20 Yes Mental Status Oriented to own ability 0 Yes Electronic Signature(s) Signed: 02/12/2016 4:58:45 PM By: Alejandro Mulling Entered By: Alejandro Mulling on 02/12/2016 14:27:23 [name] ([id number]) -------------------------------------------------------------------------------- Foot Assessment Details Patient Name: Stephanie Ket. Date of Service: 02/12/2016 2:15 PM Medical Record Number: [id number] Patient Account Number: [id number] Date of Birth/Sex: 1951/05/18 (65 y.o. Female) Treating RN: Ashok Cordia, Debi Primary Care Physician: [facility], SYED Other Clinician: Referring Physician: Toni Arthurs Treating Physician/Extender: Rudene Re in Treatment: 0 Foot Assessment Items  Unable to perform right foot assessment due to amputation Site Locations + = Sensation present, - = Sensation absent, C = Callus, U = Ulcer R = Redness, W = Warmth, M = Maceration, PU = Pre-ulcerative lesion F = Fissure, S = Swelling, D = Dryness Assessment Right:  Left: Other Deformity: No Prior Foot Ulcer: No Prior Amputation: No Charcot Joint: No Ambulatory Status: Gait: Electronic Signature(s) Signed: 02/12/2016 4:58:45 PM By: Alejandro Mulling Entered By: Alejandro Mulling on 02/12/2016 14:29:52 Sabala, [name] ([id number]) -------------------------------------------------------------------------------- Nutrition Risk Assessment Details Patient Name: Tosh, [name] Date of Service: 02/12/2016 2:15 PM Medical Record Number: [id number] Patient Account Number: [id number] Date of Birth/Sex: 03-Dec-1950 (65 y.o. Female) Treating RN: Ashok Cordia, Debi Primary Care Physician: [facility], SYED Other Clinician: Referring Physician: Toni Arthurs Treating Physician/Extender: Rudene Re in Treatment: 0 Height (in): 62 Weight (lbs): 160 Body Mass Index (BMI): 29.3 Nutrition Risk Assessment Items NUTRITION RISK SCREEN: I have an illness or condition that made me change the kind and/or 0 No amount of food I eat I eat fewer than two meals per day 0 No I eat few fruits and vegetables, or milk products 0 No I have three or more drinks of beer, liquor or wine almost every day 0 No I have tooth or mouth problems that make it hard for me to eat 0 No I don't always have enough money to buy the food I need 0 No I eat alone most of the time 0 No I take three or more different prescribed or over-the-counter drugs a 1 Yes day Without wanting to, I have lost or gained 10 pounds in the last six 0 No months I am not always physically able to shop, cook and/or feed myself 0 No Nutrition Protocols Good Risk Protocol 0 No interventions needed Moderate Risk Protocol Electronic Signature(s) Signed: 02/12/2016 4:58:45 PM By: Alejandro Mulling Entered By: Alejandro Mulling on 02/12/2016 62:95:28

## 2016-02-15 ENCOUNTER — Telehealth: Payer: Self-pay

## 2016-02-15 NOTE — Telephone Encounter (Signed)
Patient should be referred to obtain functional capacity evaluation and the notes can be used to help in filling out disability paperwork

## 2016-02-15 NOTE — Telephone Encounter (Signed)
Patient called checking status on ins. Form (disability paperwork) states she dropped off her medical records earlier this week.

## 2016-02-19 ENCOUNTER — Encounter: Payer: Commercial Managed Care - HMO | Admitting: Surgery

## 2016-02-19 ENCOUNTER — Other Ambulatory Visit: Payer: Self-pay

## 2016-02-19 DIAGNOSIS — L97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 fat layer exposed: Secondary | ICD-10-CM | POA: Diagnosis not present

## 2016-02-19 DIAGNOSIS — Z0271 Encounter for disability determination: Secondary | ICD-10-CM

## 2016-02-19 NOTE — Telephone Encounter (Signed)
[facility]

## 2016-02-20 ENCOUNTER — Ambulatory Visit
Admission: RE | Admit: 2016-02-20 | Discharge: 2016-02-20 | Disposition: A | Discharge status: Home / Self Care | Payer: Commercial Managed Care - HMO | Source: Ambulatory Visit | Attending: Surgery | Admitting: Surgery

## 2016-02-20 DIAGNOSIS — Z89511 Acquired absence of right leg below knee: Secondary | ICD-10-CM | POA: Insufficient documentation

## 2016-02-20 DIAGNOSIS — R2241 Localized swelling, mass and lump, right lower limb: Secondary | ICD-10-CM

## 2016-02-20 DIAGNOSIS — M7989 Other specified soft tissue disorders: Secondary | ICD-10-CM | POA: Diagnosis not present

## 2016-02-20 NOTE — Progress Notes (Signed)
HARBOR, PASTER ([id number]) Visit Report for 02/19/2016 Arrival Information Details Patient Name: Stephanie, [name]. Date of Service: 02/19/2016 1:30 PM Medical Record Number: [id number] Patient Account Number: [id number] Date of Birth/Sex: [birth year] (65 y.o. Female) Treating RN: [name], Debi Primary Care Physician: [facility], [facility] Other Clinician: Referring Physician: [facility], [facility] Treating Physician/Extender: [name] in Treatment: 1 Visit Information History Since Last Visit All ordered tests and consults were completed: No Patient Arrived: Crutches Added or deleted any medications: No Arrival Time: 13:36 Any new allergies or adverse reactions: No Accompanied By: self Had a fall or experienced change in No Transfer Assistance: Other activities of daily living that may affect Patient Identification Verified: Yes risk of falls: Secondary Verification Process Yes Signs or symptoms of abuse/neglect since last No Completed: visito Patient Requires Transmission-Based No Hospitalized since last visit: No Precautions: Pain Present Now: No Patient Has Alerts: No Electronic Signature(s) Signed: 02/19/2016 5:48:30 PM By: [name] Entered By: [name] on 02/19/2016 13:37:00 [name], [name] -------------------------------------------------------------------------------- Clinic Level of Care Assessment Details Patient Name: [name]. Date of Service: 02/19/2016 1:30 PM Medical Record Number: [id number] Patient Account Number: [id number] Date of Birth/Sex: 03/18/1951 (65 y.o. Female) Treating RN: [name], Debi Primary Care Physician: [facility], [facility] Other Clinician: Referring Physician: [facility], [facility] Treating Physician/Extender: [name] in Treatment: 1 Clinic Level of Care Assessment Items TOOL 4 Quantity Score  - Use when only an EandM is performed on FOLLOW-UP visit 0 ASSESSMENTS - Nursing Assessment / Reassessment  - Reassessment of  Co-morbidities (includes updates in patient status) 0 X - Reassessment of Adherence to Treatment Plan 1 5 ASSESSMENTS - Wound and Skin Assessment / Reassessment X - Simple Wound Assessment / Reassessment - one wound 1 5  - Complex Wound Assessment / Reassessment - multiple wounds 0  - Dermatologic / Skin Assessment (not related to wound area) 0 ASSESSMENTS - Focused Assessment  - Circumferential Edema Measurements - multi extremities 0  - Nutritional Assessment / Counseling / Intervention 0  - Lower Extremity Assessment (monofilament, tuning fork, pulses) 0  - Peripheral Arterial Disease Assessment (using hand held doppler) 0 ASSESSMENTS - Ostomy and/or Continence Assessment and Care  - Incontinence Assessment and Management 0  - Ostomy Care Assessment and Management (repouching, etc.) 0 PROCESS - Coordination of Care X - Simple Patient / Family Education for ongoing care 1 15  - Complex (extensive) Patient / Family Education for ongoing care 0  - Staff obtains Chiropractor, Records, Test Results / Process Orders 0  - Staff telephones HHA, Nursing Homes / Clarify orders / etc 0  - Routine Transfer to another Facility (non-emergent condition) 0 [name], [name]  - Routine Hospital Admission (non-emergent condition) 0  - New Admissions / Manufacturing engineer / Ordering NPWT, Apligraf, etc. 0  - Emergency Hospital Admission (emergent condition) 0  - Simple Discharge Coordination 0 X - Complex (extensive) Discharge Coordination 1 15 PROCESS - Special Needs  - Pediatric / Minor Patient Management 0  - Isolation Patient Management 0  - Hearing / Language / Visual special needs 0  - Assessment of Community assistance (transportation, D/C planning, etc.) 0  - Additional assistance / Altered mentation 0  - Support Surface(s) Assessment (bed, cushion, seat, etc.) 0 INTERVENTIONS - Wound Cleansing / Measurement X - Simple Wound Cleansing -  one wound 1 5  - Complex Wound Cleansing - multiple wounds 0 X - Wound Imaging (photographs - any number of wounds) 1 5  - Wound  Tracing (instead of photographs) 0  - Simple Wound Measurement - one wound 0  - Complex Wound Measurement - multiple wounds 0 INTERVENTIONS - Wound Dressings  - Small Wound Dressing one or multiple wounds 0 X - Medium Wound Dressing one or multiple wounds 1 15  - Large Wound Dressing one or multiple wounds 0  - Application of Medications - topical 0  - Application of Medications - injection 0 INTERVENTIONS - Miscellaneous  - External ear exam 0 [name], [name]  - Specimen Collection (cultures, biopsies, blood, body fluids, etc.) 0  - Specimen(s) / Culture(s) sent or taken to Lab for analysis 0  - Patient Transfer (multiple staff / Michiel Sites Lift / Similar devices) 0  - Simple Staple / Suture removal (25 or less) 0  - Complex Staple / Suture removal (26 or more) 0  - Hypo / Hyperglycemic Management (close monitor of Blood Glucose) 0  - Ankle / Brachial Index (ABI) - do not check if billed separately 0 X - Vital Signs 1 5 Has the patient been seen at the hospital within the last three years: Yes Total Score: 70 Level Of Care: New/Established - Level 2 Electronic Signature(s) Signed: 02/19/2016 5:48:30 PM By: [name] Entered By: [name] on 02/19/2016 14:44:16 Deakins, [name] ([id number]) -------------------------------------------------------------------------------- Encounter Discharge Information Details Patient Name: [name], [name] Date of Service: 02/19/2016 1:30 PM Medical Record Number: [id number] Patient Account Number: [id number] Date of Birth/Sex: [birth year] (65 y.o. Female) Treating RN: [name], Debi Primary Care Physician: [facility], [facility] Other Clinician: Referring Physician: [facility], [facility] Treating Physician/Extender: [name] in Treatment: 1 Encounter Discharge Information  Items Discharge Pain Level: 0 Discharge Condition: Stable Ambulatory Status: Crutches Discharge Destination: Home Transportation: Private Auto Accompanied By: self Schedule Follow-up Appointment: No Medication Reconciliation completed Yes and provided to Patient/Care Caelum Federici: Provided on Clinical Summary of Care: 02/19/2016 Form Type Recipient Paper Patient CG Electronic Signature(s) Signed: 02/19/2016 2:03:36 PM By: Gwenlyn Perking Entered By: Gwenlyn Perking on 02/19/2016 14:03:35 Zagal, [name] ([id number]) -------------------------------------------------------------------------------- Lower Extremity Assessment Details Patient Name: Ellinger, [name] Date of Service: 02/19/2016 1:30 PM Medical Record Number: [id number] Patient Account Number: [id number] Date of Birth/Sex: [birth year] (65 y.o. Female) Treating RN: Phillis Haggis Primary Care Physician: [facility], Kathi Ludwig Other Clinician: Referring Physician: [facility], Kathi Ludwig Treating Physician/Extender: [name] in Treatment: 1 Electronic Signature(s) Signed: 02/19/2016 5:48:30 PM By: [name] Entered By: [name] on 02/19/2016 13:39:49 Kreutzer, [name] ([id number]) -------------------------------------------------------------------------------- Multi Wound Chart Details Patient Name: [name]. Date of Service: 02/19/2016 1:30 PM Medical Record Number: [id number] Patient Account Number: [id number] Date of Birth/Sex: October 31, 1951 (65 y.o. Female) Treating RN: [name], Debi Primary Care Physician: [facility], [facility] Other Clinician: Referring Physician: [facility], [facility] Treating Physician/Extender: [name] in Treatment: 1 Vital Signs Height(in): 62 Pulse(bpm): 64 Weight(lbs): 160 Blood Pressure 120/54 (mmHg): Body Mass Index(BMI): 29 Temperature(F): 97.7 Respiratory Rate 20 (breaths/min): Photos: [2:No Photos] [N/A:N/A] Wound Location: [2:Right Amputation Site - Below Elbow] [N/A:N/A] Wounding Event:  [2:Blister] [N/A:N/A] Primary Etiology: [2:To be determined] [N/A:N/A] Comorbid History: [2:Cataracts, Hypertension, Neuropathy] [N/A:N/A] Date Acquired: [2:01/29/2016] [N/A:N/A] Weeks of Treatment: [2:1] [N/A:N/A] Wound Status: [2:Open] [N/A:N/A] Measurements L x W x D 0.1x0.1x0.1 [N/A:N/A] (cm) Area (cm) : [2:0.008] [N/A:N/A] Volume (cm) : [2:0.001] [N/A:N/A] % Reduction in Area: [2:98.00%] [N/A:N/A] % Reduction in Volume: 97.40% [N/A:N/A] Classification: [2:Partial Thickness] [N/A:N/A] Exudate Amount: [2:None Present] [N/A:N/A] Wound Margin: [2:Flat and Intact] [N/A:N/A] Granulation Amount: [2:None Present (0%)] [N/A:N/A] Necrotic Amount: [2:Large (67-100%)] [N/A:N/A] Necrotic Tissue: [2:Eschar] [  N/A:N/A] Exposed Structures: [2:Fascia: No Fat: No Tendon: No Muscle: No Joint: No Bone: No Limited to Skin Breakdown] [N/A:N/A] Epithelialization: Small (1-33%) N/A N/A Periwound Skin Texture: No Abnormalities Noted N/A N/A Periwound Skin Moist: Yes N/A N/A Moisture: Periwound Skin Color: No Abnormalities Noted N/A N/A Temperature: No Abnormality N/A N/A Tenderness on Yes N/A N/A Palpation: Wound Preparation: Ulcer Cleansing: N/A N/A Rinsed/Irrigated with Saline Topical Anesthetic Applied: Other: lidocaine 4% cream Treatment Notes Wound #2 (Right Amputation Site - Below Elbow) 1. Cleansed with: Clean wound with Normal Saline 2. Anesthetic Topical Lidocaine 4% cream to wound bed prior to debridement 5. Secondary Dressing Applied Bordered Foam Dressing Notes wound healed wearing BFD for protection of new skin Electronic Signature(s) Signed: 02/19/2016 5:48:30 PM By: Stephanie MullingPinkerton, Debra Entered By: Stephanie MullingPinkerton, Debra on 02/19/2016 14:39:37 Castrejon, [name] ([id number]) -------------------------------------------------------------------------------- Multi-Disciplinary Care Plan Details Patient Name: Stephanie KetGATES, [name] Date of Service: 02/19/2016 1:30 PM Medical Record Number:  [id number] Patient Account Number: [id number] Date of Birth/Sex: 01/05/1951 (65 y.o. Female) Treating RN: Phillis HaggisPinkerton, Debi [facility] Other Clinician: [facility] Ludwig[facility] Treating Physician/Extender: Stephanie ReBritto, Errol Weeks in Treatment: 1 Active Inactive Electronic Signature(s) Signed: 02/19/2016 5:48:30 PM By: Stephanie MullingPinkerton, Debra Entered By: Stephanie MullingPinkerton, Debra on 02/19/2016 14:41:41 Weekley, [name] ([id number]) -------------------------------------------------------------------------------- Pain Assessment Details Patient Name: Stephanie KetGATES, [name] Date of Service: 02/19/2016 1:30 PM Medical Record Number: [id number] Patient Account Number: [id number] Date of Birth/Sex: 01/05/1951 (65 y.o. Female) Treating RN: Phillis HaggisPinkerton, Debi [facility] Other Clinician: [facility], Kathi LudwigSYED Treating Physician/Extender: Stephanie ReBritto, Errol Weeks in Treatment: 1 Active Problems Location of Pain Severity and Description of Pain Patient Has Paino No Site Locations Pain Management and Medication Current Pain Management: Electronic Signature(s) Signed: 02/19/2016 5:48:30 PM By: Stephanie MullingPinkerton, Debra Entered By: Stephanie MullingPinkerton, Debra on 02/19/2016 13:39:17 [place], [name] ([id number]) -------------------------------------------------------------------------------- Patient/Caregiver Education Details Patient Name: Stephanie KetGATES, Terril F. Date of Service: 02/19/2016 1:30 PM Medical Record Number: [id number] Patient Account Number: [id number] Date of Birth/Gender: 01/05/1951 (65 y.o. Female) Treating RN: Stephanie CordiaPinkerton, Debi [facility] Other Clinician: Referring Physician: [facility], Kathi LudwigSYED Treating Physician/Extender: Stephanie ReBritto, Errol Weeks in Treatment: 1 Education Assessment Education Provided To: Patient Education Topics Provided Wound/Skin Impairment: Handouts: Other: call us if you need anything Methods: Explain/Verbal Responses: State content  correctly Electronic Signature(s) Signed: 02/19/2016 5:48:30 PM By: Stephanie MullingPinkerton, Debra Entered By: Stephanie MullingPinkerton, Debra on 02/19/2016 13:59:45 Whittle, [name] ([id number]) -------------------------------------------------------------------------------- Wound Assessment Details Patient Name: Stephanie KetGATES, [name] Date of Service: 02/19/2016 1:30 PM Medical Record Number: [id number] Patient Account Number: [id number] Date of Birth/Sex: 01/05/1951 (65 y.o. Female) Treating RN: Stephanie CordiaPinkerton, Debi [facility] Other Clinician: [facility] Treating Physician/Extender: Stephanie ReBritto, Errol Weeks in Treatment: 1 Wound Status Wound Number: 2 Primary Etiology: Trauma, Other Wound Location: Right Amputation Site - Below Wound Status: Healed - Epithelialized Elbow Comorbid Cataracts, Hypertension, Wounding Event: Blister History: Neuropathy Date Acquired: 01/29/2016 Weeks Of Treatment: 1 Clustered Wound: No Photos Photo Uploaded By: Stephanie MullingPinkerton, Debra on 02/19/2016 17:18:18 Wound Measurements Length: (cm) 0 % Reduction Width: (cm) 0 % Reduction Depth: (cm) 0 Epithelializ Area: (cm) 0 Tunneling: Volume: (cm) 0 Undermining in Area: 100% in Volume: 100% ation: Large (67-100%) No : No Wound Description Classification: Partial Thickness Wound Margin: Flat and Intact Exudate Amount: None Present Foul Odor After Cleansing: No Wound Bed Granulation Amount: None Present (0%) Exposed Structure Necrotic Amount: None Present (0%) Fascia Exposed: No Fat Layer Exposed: No Tendon Exposed: No Muscle Exposed: No Labarre, Stephanie  F. ([id number]) Joint Exposed: No Bone Exposed: No Limited to Skin Breakdown Periwound Skin Texture Texture Color No Abnormalities Noted: No No Abnormalities Noted: No Moisture Temperature / Pain No Abnormalities Noted: No Temperature: No Abnormality Moist: No Tenderness on Palpation: Yes Wound Preparation Ulcer Cleansing: Rinsed/Irrigated  with Saline Topical Anesthetic Applied: None Treatment Notes Wound #2 (Right Amputation Site - Below Elbow) 1. Cleansed with: Clean wound with Normal Saline 2. Anesthetic Topical Lidocaine 4% cream to wound bed prior to debridement 5. Secondary Dressing Applied Bordered Foam Dressing Notes wound healed wearing BFD for protection of new skin Electronic Signature(s) Signed: 02/19/2016 5:48:30 PM By: [name] Entered By: [name] on 02/19/2016 14:40:58 Kehres, [name] ([id number]) -------------------------------------------------------------------------------- Vitals Details Patient Name: [name]. Date of Service: 02/19/2016 1:30 PM Medical Record Number: [id number] Patient Account Number: [id number] Date of Birth/Sex: 1951/08/26 (65 y.o. Female) Treating RN: [name], Debi Primary Care Physician: [facility], [facility] Other Clinician: Referring Physician: [facility], [facility] Treating Physician/Extender: [name] in Treatment: 1 Vital Signs Time Taken: 13:39 Temperature (F): 97.7 Height (in): 62 Pulse (bpm): 64 Weight (lbs): 160 Respiratory Rate (breaths/min): 20 Body Mass Index (BMI): 29.3 Blood Pressure (mmHg): 120/54 Reference Range: 80 - 120 mg / dl Electronic Signature(s) Signed: 02/19/2016 5:48:30 PM By: [name] Entered By: [name] on 02/19/2016 13:39:37

## 2016-02-20 NOTE — Progress Notes (Signed)
NEHAL, WITTING ([id number]) Visit Report for 02/19/2016 Chief Complaint Document Details Patient Name: [name], [name]. Date of Service: 02/19/2016 1:30 PM Medical Record Number: [id number] Patient Account Number: [id number] Date of Birth/Sex: [birth year] (65 y.o. Female) Treating RN: Ashok Cordia, Debi Primary Care Physician: [facility], Kathi Ludwig Other Clinician: Referring Physician: [facility], Kathi Ludwig Treating Physician/Extender: Rudene Re in Treatment: 1 Information Obtained from: Patient Chief Complaint Patient seen for complaints of Non-Healing Wound to the right below-knee amputation stump with a swelling around this area for about 3 weeks Electronic Signature(s) Signed: 02/19/2016 1:58:57 PM By: Evlyn Kanner MD, FACS Entered By: Evlyn Kanner on 02/19/2016 13:58:57 Coey, Coy Saunas ([id number]) -------------------------------------------------------------------------------- HPI Details Patient Name: Otho Ket. Date of Service: 02/19/2016 1:30 PM Medical Record Number: [id number] Patient Account Number: [id number] Date of Birth/Sex: 03-07-1951 (65 y.o. Female) Treating RN: Ashok Cordia, Debi Primary Care Physician: [facility], SYED Other Clinician: Referring Physician: [facility], [facility] Treating Physician/Extender: Rudene Re in Treatment: 1 History of Present Illness Location: right below-knee area stump with swelling and ulceration Quality: Patient reports experiencing a dull pain to affected area(s). Severity: Patient states wound are getting worse. Duration: Patient has had the wound for < 3 weeks prior to presenting for treatment Timing: Pain in wound is Intermittent (comes and goes Context: The wound appeared gradually over time Modifying Factors: Other treatment(s) tried include: orthopedic office was advised to local care and wound center consult Associated Signs and Symptoms: Patient reports having increase swelling. HPI Description: 65 year old patient who was seen earlier about 2  years ago at our wound center for a nonhealing right BKA stump. She had a couple of attempts at salvage of her right foot with orthopedic related surgery but ultimately required her BKA done by Dr. Toni Arthurs in [place] In February 2015. During that period of time she had also received hyperbaric oxygen therapy. She saw Dr. Laverta Baltimore PA Mr. Alfredo Martinez, last week for a follow-up of her right below-knee amputation. She has had a new wound with clear yellow drainage when she wears a processes. Past medical history significant for Charcot's joint of the right ankle, status post right BKA, was never a smoker. also is known to have scoliosis, heart murmur, hypertension, status post back surgeries, cholecystectomy, spinal fusion,hip replacement, right below-knee amputation, total left hip arthroplasty in September 2016. When the PA saw her there was a 1.5 cm linear ulcer in the central aspect of the incision and there was no evidence of cellulitis or drainage. She was referred to [place] for aggressive treatment of her ulcer before it progresses and would result in an above-the-knee amputation. She was advised to discontinue use of a prosthesis until the ulcer heals 02/19/2016 -- he had an x-ray of her right below-knee amputation stump -- x-ray of the right knee shows no acute bony pathology. the ultrasound of the soft tissue in this region has not been completed yet and she is scheduled for this tomorrow. She also has an appointment to see Dr. Victorino Dike on this coming Friday. Electronic Signature(s) Signed: 02/19/2016 1:59:45 PM By: Evlyn Kanner MD, FACS Entered By: Evlyn Kanner on 02/19/2016 13:59:44 Whitefield, Coy Saunas ([id number]) -------------------------------------------------------------------------------- Physical Exam Details Patient Name: BRELEE, RENK. Date of Service: 02/19/2016 1:30 PM Medical Record Number: [id number] Patient Account Number: [id number] Date of Birth/Sex: 03/09/1951 (65 y.o.  Female) Treating RN: Phillis Haggis Primary Care Physician: [facility], Kathi Ludwig Other Clinician: Referring Physician: [facility], SYED Treating Physician/Extender: Rudene Re in Treatment: 1 Constitutional . Pulse regular. Respirations normal and unlabored. Afebrile. Marland Kitchen  Eyes Nonicteric. Reactive to light. Ears, Nose, Mouth, and Throat Lips, teeth, and gums WNL.Marland Kitchen Moist mucosa without lesions. Neck supple and nontender. No palpable supraclavicular or cervical adenopathy. Normal sized without goiter. Respiratory WNL. No retractions.. Breath sounds WNL, No rubs, rales, rhonchi, or wheeze.. Cardiovascular Heart rhythm and rate regular, no murmur or gallop.. Pedal Pulses WNL. No clubbing, cyanosis or edema. Chest Breasts symmetical and no nipple discharge.. Breast tissue WNL, no masses, lumps, or tenderness.. Lymphatic No adneopathy. No adenopathy. No adenopathy. Musculoskeletal Adexa without tenderness or enlargement.. Digits and nails w/o clubbing, cyanosis, infection, petechiae, ischemia, or inflammatory conditions.. Integumentary (Hair, Skin) No suspicious lesions. No crepitus or fluctuance. No peri-wound warmth or erythema. No masses.Marland Kitchen Psychiatric Judgement and insight Intact.. No evidence of depression, anxiety, or agitation.. Notes the ulcer has completely healed and I have carefully remove the eschar surrounding it and there is no open wound. The previously noted ballotable fluctuant swelling in the region immediately above the BKA stump is a little smaller and an ultrasound will be done tomorrow. Electronic Signature(s) Signed: 02/19/2016 2:00:39 PM By: Evlyn Kanner MD, FACS Entered By: Evlyn Kanner on 02/19/2016 14:00:39 Kovar, Coy Saunas ([id number]) -------------------------------------------------------------------------------- Physician Orders Details Patient Name: Otho Ket. Date of Service: 02/19/2016 1:30 PM Medical Record Number: [id number] Patient Account Number:  [id number] Date of Birth/Sex: [birth year] (65 y.o. Female) Treating RN: Ashok Cordia, Debi Primary Care Physician: [facility], [place] Other Clinician: Referring Physician: [facility], Kathi Ludwig Treating Physician/Extender: Rudene Re in Treatment: 1 Verbal / Phone Orders: Yes Clinician: Pinkerton, Debi Read Back and Verified: Yes Diagnosis Coding Primary Wound Dressing o Boardered Foam Dressing - to protect new skin Discharge From [facility] o Discharge from Wound Care Center - call if you need anything please keep and go to your ultrasound apt and your apt with Dr. Victorino Dike Electronic Signature(s) Signed: 02/19/2016 4:52:55 PM By: Evlyn Kanner MD, FACS Signed: 02/19/2016 5:48:30 PM By: Alejandro Mulling Entered By: Alejandro Mulling on 02/19/2016 14:43:18 Runnion, Coy Saunas ([id number]) -------------------------------------------------------------------------------- Problem List Details Patient Name: Mullally, Cheryle F. Date of Service: 02/19/2016 1:30 PM Medical Record Number: [id number] Patient Account Number: [id number] Date of Birth/Sex: 11-29-1950 (65 y.o. 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 Date of Service: 02/19/2016 1:30 PM Medical Record Number:  [id number] Patient Account Number: [id number] Date of Birth/Sex: 1951/07/12 (65 y.o. Female) Treating RN: Ashok Cordia, Debi Primary Care Physician: [facility], [facility] Other Clinician: Referring Physician: [facility], [facility] Treating Physician/Extender: Rudene Re in Treatment: 1 Subjective Chief Complaint Information obtained from Patient Patient seen for complaints of Non-Healing Wound to the right below-knee amputation stump with a swelling around this area for about 3 weeks History of Present Illness (HPI) The following HPI elements were documented for the patient's wound: Location: right below-knee area stump with swelling and ulceration Quality: Patient reports experiencing a dull pain to affected area(s). Severity: Patient states wound are getting worse. Duration: Patient has had the wound for < 3 weeks prior to presenting for treatment  Timing: Pain in wound is Intermittent (comes and goes Context: The wound appeared gradually over time Modifying Factors: Other treatment(s) tried include: orthopedic office was advised to local care and wound center consult Associated Signs and Symptoms: Patient reports having increase swelling. 65 year old patient who was seen earlier about 2 years ago at our wound center for a nonhealing right BKA stump. She had a couple of attempts at salvage of her right foot with orthopedic related surgery but ultimately required her BKA done by Dr. Toni Arthurs in [place] In February 2015. During that period of time she had also received hyperbaric oxygen therapy. She saw Dr. Laverta Baltimore PA Mr. Alfredo Martinez, last week for a follow-up of her right below-knee amputation. She has had a new wound with clear yellow drainage when she wears a processes. Past medical history significant for Charcot's joint of the right ankle, status post right BKA, was never a smoker. also is known to have scoliosis, heart murmur, hypertension, status post back surgeries, cholecystectomy,  spinal fusion,hip replacement, right below-knee amputation, total left hip arthroplasty in September 2016. When the PA saw her there was a 1.5 cm linear ulcer in the central aspect of the incision and there was no evidence of cellulitis or drainage. She was referred to [place] for aggressive treatment of her ulcer before it progresses and would result in an above-the-knee amputation. She was advised to discontinue use of a prosthesis until the ulcer heals 02/19/2016 -- he had an x-ray of her right below-knee amputation stump -- x-ray of the right knee shows no acute bony pathology. the ultrasound of the soft tissue in this region has not been completed yet and she is scheduled for this tomorrow. She also has an appointment to see Dr. Victorino Dike on this coming Friday. GUILIANA, SHOR ([id number]) Objective Constitutional Pulse regular. Respirations normal and unlabored. Afebrile. Vitals Time Taken: 1:39 PM, Height: 62 in, Weight: 160 lbs, BMI: 29.3, Temperature: 97.7 F, Pulse: 64 bpm, Respiratory Rate: 20 breaths/min, Blood Pressure: 120/54 mmHg. Eyes Nonicteric. Reactive to light. Ears, Nose, Mouth, and Throat Lips, teeth, and gums WNL.Marland Kitchen Moist mucosa without lesions. Neck supple and nontender. No palpable supraclavicular or cervical adenopathy. Normal sized without goiter. Respiratory WNL. No retractions.. Breath sounds WNL, No rubs, rales, rhonchi, or wheeze.. Cardiovascular Heart rhythm and rate regular, no murmur or gallop.. Pedal Pulses WNL. No clubbing, cyanosis or edema. Chest Breasts symmetical and no nipple discharge.. Breast tissue WNL, no masses, lumps, or tenderness.. Lymphatic No adneopathy. No adenopathy. No adenopathy. Musculoskeletal Adexa without tenderness or enlargement.. Digits and nails w/o clubbing, cyanosis, infection, petechiae, ischemia, or inflammatory conditions.Marland Kitchen Psychiatric Judgement and insight Intact.. No evidence of depression, anxiety, or agitation.. General  Notes: the ulcer has completely healed and I have carefully remove the eschar surrounding it and there is no open wound. The previously noted ballotable fluctuant swelling in the region immediately above the BKA stump is a little smaller and an ultrasound will be done tomorrow. Integumentary (Hair, Skin) No suspicious lesions. No crepitus or fluctuance. No peri-wound warmth or erythema. No masses.Marland Kitchen EMMERIE, BATTAGLIA ([id number]) Wound #2 status is Healed - Epithelialized. Original cause of wound was Blister. The wound is located on the Right Amputation Site - Below Elbow. The wound measures 0cm length x 0cm width x 0cm depth; 0cm^2 area and 0cm^3 volume. The wound is limited to skin breakdown. There is no tunneling or undermining noted. There is a none present amount of drainage noted. The wound margin is flat and intact.  There is no granulation within the wound bed. There is no necrotic tissue within the wound bed. The periwound skin appearance did not exhibit: Moist. Periwound temperature was noted as No Abnormality. The periwound has tenderness on palpation. Assessment Active Problems ICD-10 Z89.511 - Acquired absence of right leg below knee L97.212 - Non-pressure chronic ulcer of right calf with fat layer exposed M71.38 - Other bursal cyst, other site At this stage since her ulcer has healed I have recommended offloading to be continued and a foam border to be applied over this. She will get her ultrasound done and see Dr. Victorino DikeHewitt in follow-up I have discharged her from the wound care center and will see her back as needed Plan Primary Wound Dressing: Boardered Foam Dressing - to protect new skin [facility]charge from [facility] Care Center - call if you need anything please keep and go to your ultrasound apt and your apt with Dr. Victorino DikeHewitt At this stage since her ulcer has healed I have recommended offloading to be continued and a foam border to be applied over  this. Otho KetGATES, Alyana F. ([id number]) She will get her ultrasound done and see Dr. Victorino DikeHewitt in follow-up I have discharged her from the wound care center and will see her back as needed Electronic Signature(s) Signed: 02/19/2016 5:00:36 PM By: Evlyn KannerBritto, Genevie Elman MD, FACS Previous Signature: 02/19/2016 2:01:44 PM Version By: Evlyn KannerBritto, Channel Papandrea MD, FACS Entered By: Evlyn KannerBritto, Crosby Oriordan on 02/19/2016 17:00:36 Gavin, Coy SaunasAROL F. ([id number]) -------------------------------------------------------------------------------- SuperBill Details Patient Name: Otho KetGATES, Tanae F. Date of Service: 02/19/2016 Medical Record Number: [id number] Patient Account Number: [id number] Date of Birth/Sex: June 25, 1951 (65 y.o. Female) Treating RN: Ashok CordiaPinkerton, Debi [facility] Other Clinician: [facility] Ludwig[facility] Treating Physician/Extender: Rudene ReBritto, Jaquelynn Wanamaker Weeks in Treatment: 1 Diagnosis Coding ICD-10 Codes Code Description Z89.511 Acquired absence of right leg below knee L97.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 fat layer exposed M71.38 Other bursal cyst, other site Facility Procedures CPT4 Code: [id number] Description: [phone] - WOUND CARE VISIT-LEV 2 EST PT Modifier: Quantity: 1 Physician Procedures CPT4 Code: [id number] Description: 99213 - WC PHYS LEVEL 3 - EST PT ICD-10 Description Diagnosis Z89.511 Acquired absence of right leg below knee L97.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 fat M71.38 Other bursal cyst, other site Modifier: layer exposed Quantity: 1 Electronic Signature(s) Signed: 02/19/2016 4:52:55 PM By: Evlyn KannerBritto, Carlton Sweaney MD, FACS Signed: 02/19/2016 5:48:30 PM By: Alejandro MullingPinkerton, Debra Previous Signature: 02/19/2016 2:02:04 PM Version By: Evlyn KannerBritto, Majorie Santee MD, FACS Entered By: Alejandro MullingPinkerton, Debra on 02/19/2016 14:44:25

## 2016-03-06 ENCOUNTER — Encounter: Payer: Self-pay | Admitting: Family Medicine

## 2016-03-06 ENCOUNTER — Ambulatory Visit (INDEPENDENT_AMBULATORY_CARE_PROVIDER_SITE_OTHER): Payer: Commercial Managed Care - HMO | Admitting: Family Medicine

## 2016-03-06 VITALS — BP 108/62 | HR 77 | Temp 97.8°F | Resp 16 | Ht 62.0 in | Wt 160.0 lb

## 2016-03-06 DIAGNOSIS — Z13220 Encounter for screening for lipoid disorders: Secondary | ICD-10-CM | POA: Diagnosis not present

## 2016-03-06 DIAGNOSIS — R011 Cardiac murmur, unspecified: Secondary | ICD-10-CM

## 2016-03-06 DIAGNOSIS — I38 Endocarditis, valve unspecified: Secondary | ICD-10-CM

## 2016-03-06 DIAGNOSIS — I10 Essential (primary) hypertension: Secondary | ICD-10-CM

## 2016-03-06 DIAGNOSIS — Z1159 Encounter for screening for other viral diseases: Secondary | ICD-10-CM | POA: Insufficient documentation

## 2016-03-06 NOTE — Progress Notes (Signed)
Name: [name]   MRN: [id number]    DOB: [birth year]   Date:03/06/2016       Progress Note  Subjective  Chief Complaint  Chief Complaint  Patient presents with  . Follow-up    patient is here for a 3476-month f/u. patient stated that everything is going well.    Hypertension This is a chronic problem. The problem is unchanged. The problem is controlled. Pertinent negatives include no blurred vision, chest pain, headaches, palpitations or shortness of breath. Past treatments include diuretics and beta blockers. There is no history of kidney disease, CAD/MI or CVA.    Past Medical History  Diagnosis Date  . Scoliosis   . Heart murmur     for years, not heard now  . Hypertension   . Headache(784.0)     non post menopausal  . Neuropathic bladder   . UTI (lower urinary tract infection)     Dut to neuropathic bladder  . History of blood transfusion     post back surgeries- '90's  . Complication of anesthesia   . PONV (postoperative nausea and vomiting)     Past Surgical History  Procedure Laterality Date  . Back surgery    . Anterior release vertebral body w/ posterior fusion  1991    2 surgeries   . Tonsillectomy  1973  . Seportinoplasty  1980  . Cholecystectomy  1991  . Anterior and posterior spinal fusion  1995    L5- S  . Dilation and curettage of uterus  2004  . Hysteroscopy  2004  . Elbow wedge osteotomy  2005    L4  . Hardware replaced  2005  . Hardware removal  2006    T5  . P/p fusion Right 2010    2nd, 3rd, 4th  . Orif metatarsal fracture Right 2011    1 and 2  . Tendon release Right 2011  . Ankle fusion Right 2013    tendon release right 2nd toe  . Bone biospy Right     ankle  . Joint replacement Right 2008    hip  . Revision total hip arthroplasty Right 2011  . Amputation Right 01/13/2014    Procedure: AMPUTATION BELOW RIGHT KNEE;  Surgeon: Toni ArthursJohn Hewitt, MD;  Location: [facility] OR;  Service: Orthopedics;  Laterality: Right;  . Breast surgery Left 1990     biospy non cancer  . Total hip arthroplasty Left 08/02/2015    Procedure: TOTAL HIP ARTHROPLASTY;  Surgeon: Donato HeinzJames P Hooten, MD;  Location: [facility] ORS;  Service: Orthopedics;  Laterality: Left;    Family History  Problem Relation Age of Onset  . Hypertension Mother   . Hypertension Father   . CAD Father   . Hypertension Son   . Obesity Sister   . Obesity Brother   . Hypertension Sister     Social History   Social History  . Marital Status: Married    Spouse Name: N/A  . Number of Children: N/A  . Years of Education: N/A   Occupational History  . Not on file.   Social History Main Topics  . Smoking status: Never Smoker   . Smokeless tobacco: Never Used  . Alcohol Use: No  . Drug Use: No  . Sexual Activity: Yes   Other Topics Concern  . Not on file   Social History Narrative     Current outpatient prescriptions:  .  atenolol (TENORMIN) 25 MG tablet, Take 1 tablet (25 mg total) by mouth every morning.,  Disp: 90 tablet, Rfl: 0 .  conjugated estrogens (PREMARIN) vaginal cream, Pea sized amount to urethra 3 times weekly at bedtime., Disp: 42.5 g, Rfl: 12 .  diphenhydramine-acetaminophen (TYLENOL PM) 25-500 MG TABS, Take 2 tablets by mouth at bedtime., Disp: , Rfl:  .  gabapentin (NEURONTIN) 300 MG capsule, Take 2 capsules (600 mg total) by mouth 3 (three) times daily., Disp: 540 capsule, Rfl: 0 .  Incontinence Supply Disposable (BLADDER CONTROL PADS EX ABSORB) MISC, , Disp: , Rfl:  .  senna (SENOKOT) 8.6 MG TABS tablet, Take 2 tablets by mouth daily., Disp: , Rfl:  .  triamterene-hydrochlorothiazide (DYAZIDE) 37.5-25 MG capsule, Take 1 each (1 capsule total) by mouth every morning., Disp: 90 capsule, Rfl: 0  Allergies  Allergen Reactions  . Dilaudid [Hydromorphone] Shortness Of Breath    Respiratory arrest Respiratory arrest  . Benzoin Rash  . Other Rash    "Old kind of Adhesive Tape".  . Nsaids     Other reaction(s): Kidney Disorder Renal failure  . Tape Rash      Review of Systems  Eyes: Negative for blurred vision.  Respiratory: Negative for shortness of breath.   Cardiovascular: Negative for chest pain and palpitations.  Neurological: Negative for headaches.     Objective  Filed Vitals:   03/06/16 0925  BP: 108/62  Pulse: 77  Temp: 97.8 F (36.6 C)  TempSrc: Oral  Resp: 16  Height:  (1.575 m)  Weight: 160 lb (72.576 kg)  SpO2: 98%    Physical Exam  Constitutional: She is oriented to person, place, and time and well-developed, well-nourished, and in no distress.  Cardiovascular: Normal rate, regular rhythm, S1 normal and S2 normal.   Murmur heard.  Diastolic murmur is present with a grade of 2/6  Pulmonary/Chest: Effort normal and breath sounds normal.  Neurological: She is alert and oriented to person, place, and time.  Nursing note and vitals reviewed.    Assessment & Plan  1. Essential hypertension Blood pressure at goal, we will obtain BMP for evaluation of kidney function. - Basic Metabolic Panel (BMET)  2. Screening for lipid disorders  - Lipid Profile  3. Diastolic murmur  Patient has a known murmur, has been evaluated by cardiology.   Kery Haltiwanger [name] Faylene Kurtz [facility] 03/06/2016 9:51 AM

## 2016-03-07 LAB — LIPID PANEL
Chol/HDL Ratio: 5.6 ratio units — ABNORMAL HIGH (ref 0.0–4.4)
Cholesterol, Total: 276 mg/dL — ABNORMAL HIGH (ref 100–199)
HDL: 49 mg/dL (ref >39)
Triglycerides: 411 mg/dL — ABNORMAL HIGH (ref 0–149)

## 2016-03-07 LAB — BASIC METABOLIC PANEL
BUN/Creatinine Ratio: 15 (ref 12–28)
BUN: 16 mg/dL (ref 8–27)
CHLORIDE: 98 mmol/L (ref 96–106)
CO2: 29 mmol/L (ref 18–29)
Calcium: 9.5 mg/dL (ref 8.7–10.3)
Creatinine, Ser: 1.07 mg/dL — ABNORMAL HIGH (ref 0.57–1.00)
GFR calc Af Amer: 63 mL/min/{1.73_m2} (ref >59)
GFR calc non Af Amer: 55 mL/min/{1.73_m2} — ABNORMAL LOW (ref >59)
GLUCOSE: 80 mg/dL (ref 65–99)
POTASSIUM: 4.8 mmol/L (ref 3.5–5.2)
SODIUM: 142 mmol/L (ref 134–144)

## 2016-04-03 ENCOUNTER — Other Ambulatory Visit: Payer: Self-pay | Admitting: Family Medicine

## 2016-06-10 ENCOUNTER — Other Ambulatory Visit: Payer: Self-pay | Admitting: Family Medicine

## 2016-07-30 ENCOUNTER — Encounter: Payer: Self-pay | Admitting: Family Medicine

## 2016-07-30 ENCOUNTER — Ambulatory Visit (INDEPENDENT_AMBULATORY_CARE_PROVIDER_SITE_OTHER): Payer: Medicare Other | Admitting: Family Medicine

## 2016-07-30 VITALS — BP 110/64 | HR 62 | Temp 97.7°F | Resp 16 | Ht 62.0 in | Wt 163.2 lb

## 2016-07-30 DIAGNOSIS — G629 Polyneuropathy, unspecified: Secondary | ICD-10-CM | POA: Diagnosis not present

## 2016-07-30 DIAGNOSIS — I10 Essential (primary) hypertension: Secondary | ICD-10-CM

## 2016-07-30 DIAGNOSIS — T753XXD Motion sickness, subsequent encounter: Secondary | ICD-10-CM

## 2016-07-30 MED ORDER — SCOPOLAMINE 1 MG/3DAYS TD PT72: 1.0000 | MEDICATED_PATCH | TRANSDERMAL | 0 refills | Status: AC

## 2016-07-30 MED ORDER — ATENOLOL 25 MG PO TABS: 25.0000 mg | ORAL_TABLET | Freq: Every morning | ORAL | 0 refills | Status: DC

## 2016-07-30 MED ORDER — TRIAMTERENE-HCTZ 37.5-25 MG PO CAPS: 1.0000 | ORAL_CAPSULE | Freq: Every morning | ORAL | 0 refills | Status: DC

## 2016-07-30 NOTE — Progress Notes (Signed)
Name: [name]   MRN: [id number]    DOB: [birth year]   Date:07/30/2016       Progress Note  Subjective  Chief Complaint  Chief Complaint  Patient presents with  . Hypertension    Hypertension  This is a chronic problem. The problem is unchanged. The problem is controlled. Pertinent negatives include no blurred vision, chest pain, headaches, palpitations or shortness of breath. Past treatments include diuretics and beta blockers. There is no history of kidney disease, CAD/MI or CVA.   Patient is also concerned about efficacy of gabapentin for neuropathic pain in her feet. She is on gabapentin 600 mg 3 times daily, she reports that oftentimes, the pain is worse at night, described as burning and stinging sensation, she admits to having taken the fourth dose of gabapentin late at night if pain is not relieved. She wishes to be started on Lyrica but is concerned about insurance coverage.  [name] is also going on a cruise in [place] and requesting scopolamine patches for seasickness, has used scopolamine patches in the past for prevention of seasickness, works well.   Past Medical History:  Diagnosis Date  . Complication of anesthesia   . Headache(784.0)    non post menopausal  . Heart murmur    for years, not heard now  . History of blood transfusion    post back surgeries- '90's  . Hypertension   . Neuropathic bladder   . PONV (postoperative nausea and vomiting)   . Scoliosis   . UTI (lower urinary tract infection)    Dut to neuropathic bladder    Past Surgical History:  Procedure Laterality Date  . AMPUTATION Right 01/13/2014   Procedure: AMPUTATION BELOW RIGHT KNEE;  Surgeon: Stephanie ArthursJohn Hewitt, MD;  Location: [facility];  Service: Orthopedics;  Laterality: Right;  . ANKLE FUSION Right 2013   tendon release right 2nd toe  . ANTERIOR AND POSTERIOR SPINAL FUSION  1995   L5- S  . ANTERIOR RELEASE VERTEBRAL BODY W/ POSTERIOR FUSION  1991   2 surgeries   . BACK SURGERY    . bone biospy  Right    ankle  . BREAST SURGERY Left 1990   biospy non cancer  . CHOLECYSTECTOMY  1991  . DILATION AND CURETTAGE OF UTERUS  2004  . ELBOW WEDGE OSTEOTOMY  2005   L4  . HARDWARE REMOVAL  2006   T5  . Hardware replaced  2005  . HYSTEROSCOPY  2004  . JOINT REPLACEMENT Right 2008   hip  . ORIF METATARSAL FRACTURE Right 2011   1 and 2  . P/P fusion Right 2010   2nd, 3rd, 4th  . REVISION TOTAL HIP ARTHROPLASTY Right 2011  . seportinoplasty  1980  . TENDON RELEASE Right 2011  . TONSILLECTOMY  1973  . TOTAL HIP ARTHROPLASTY Left 08/02/2015   Procedure: TOTAL HIP ARTHROPLASTY;  Surgeon: Stephanie HeinzJames P Hooten, MD;  Location: [facility] ORS;  Service: Orthopedics;  Laterality: Left;    Family History  Problem Relation Age of Onset  . Hypertension Mother   . Hypertension Father   . CAD Father   . Hypertension Son   . Obesity Sister   . Obesity Brother   . Hypertension Sister     Social History   Social History  . Marital status: Married    Spouse name: N/A  . Number of children: N/A  . Years of education: N/A   Occupational History  . Not on file.   Social History Main Topics  .  Smoking status: Never Smoker  . Smokeless tobacco: Never Used  . Alcohol use No  . Drug use: No  . Sexual activity: Yes   Other Topics Concern  . Not on file   Social History Narrative  . No narrative on file     Current Outpatient Prescriptions:  .  atenolol (TENORMIN) 25 MG tablet, TAKE 1 TABLET EVERY MORNING, Disp: 90 tablet, Rfl: 0 .  conjugated estrogens (PREMARIN) vaginal cream, Pea sized amount to urethra 3 times weekly at bedtime., Disp: 42.5 g, Rfl: 12 .  diphenhydramine-acetaminophen (TYLENOL PM) 25-500 MG TABS, Take 2 tablets by mouth at bedtime., Disp: , Rfl:  .  gabapentin (NEURONTIN) 300 MG capsule, TAKE 2 CAPSULES THREE TIMES DAILY, Disp: 540 capsule, Rfl: 1 .  Incontinence Supply Disposable (BLADDER CONTROL PADS EX ABSORB) MISC, , Disp: , Rfl:  .  senna (SENOKOT) 8.6 MG TABS tablet,  Take 2 tablets by mouth daily., Disp: , Rfl:  .  triamterene-hydrochlorothiazide (DYAZIDE) 37.5-25 MG capsule, TAKE 1 CAPSULE EVERY MORNING, Disp: 90 capsule, Rfl: 0  Allergies  Allergen Reactions  . Dilaudid [Hydromorphone] Shortness Of Breath    Respiratory arrest Respiratory arrest  . Benzoin Rash  . Other Rash    "Old kind of Adhesive Tape".  . Nsaids     Other reaction(s): Kidney Disorder Renal failure  . Tape Rash    Review of Systems  Eyes: Negative for blurred vision.  Respiratory: Negative for shortness of breath.   Cardiovascular: Negative for chest pain and palpitations.  Neurological: Negative for headaches.    Objective  Vitals:   07/30/16 1006  BP: 110/64  Pulse: 62  Resp: 16  Temp: 97.7 F (36.5 C)  SpO2: 98%  Weight: 163 lb 3 oz (74 kg)  Height: 5\' 2"  (1.575 m)    Physical Exam  Constitutional: She is well-developed, well-nourished, and in no distress.  HENT:  Head: Normocephalic and atraumatic.  Cardiovascular: Normal rate, regular rhythm, S1 normal and S2 normal.   Murmur heard.  Systolic murmur is present with a grade of 2/6  Pulmonary/Chest: Effort normal and breath sounds normal. She has no wheezes.  Musculoskeletal:       Left ankle: She exhibits swelling.  Trace pitting edema left lower extremity. Right prosthetic leg.  Psychiatric: Mood, memory, affect and judgment normal.  Nursing note and vitals reviewed.     Assessment & Plan  1. Sea sickness, subsequent encounter Prescription for scopolamine patches provided - scopolamine (TRANSDERM-SCOP) 1 MG/3DAYS; Place 1 patch (1.5 mg total) onto the skin every 3 (three) days.  Dispense: 10 patch; Refill: 0  2. Peripheral polyneuropathy (HCC) Advised to find out about insurance coverage regarding Lyrica, if covered, we will need to taper off gabapentin and start on Lyrica. Patient verbalized agreement and will call back  3. Essential hypertension BP stable and controlled on present  anti-hypertensive therapy - atenolol (TENORMIN) 25 MG tablet; Take 1 tablet (25 mg total) by mouth every morning.  Dispense: 90 tablet; Refill: 0 - triamterene-hydrochlorothiazide (DYAZIDE) 37.5-25 MG capsule; Take 1 each (1 capsule total) by mouth every morning.  Dispense: 90 capsule; Refill: 0   Malyna Budney [name] [facility] 07/30/2016 10:37 AM

## 2016-07-31 ENCOUNTER — Telehealth: Payer: Self-pay | Admitting: Family Medicine

## 2016-07-31 DIAGNOSIS — G629 Polyneuropathy, unspecified: Secondary | ICD-10-CM

## 2016-07-31 NOTE — Telephone Encounter (Signed)
Patient will begin to taper off gabapentin after she returns from her trip. She is currently taking 600 mg of gabapentin 3 times a day for a total of 1800 mg daily for peripheral neuropathy. Advised to decrease gabapentin by 300 mg every 3 days and she can start taking Lyrica when she is taking a total of 600 mg of gabapentin daily. We will start on Lyrica 75 mg twice daily for 3 months and reassess in 3 months. Please send a prescription for Lyrica 75 mg by mouth twice a day #180 #0 refills to patient's mail-order pharmacy.

## 2016-08-13 MED ORDER — PREGABALIN 75 MG PO CAPS: 75.0000 mg | ORAL_CAPSULE | Freq: Two times a day (BID) | ORAL | 0 refills | Status: DC

## 2016-08-13 NOTE — Addendum Note (Signed)
Addended bySherryll Burger: Keyauna Graefe A A on: 08/13/2016 09:54 AM   Modules accepted: Orders

## 2016-08-13 NOTE — Telephone Encounter (Signed)
Prescription for Lyrica is printed and will be faxed to mail order pharmacy

## 2016-08-13 NOTE — Telephone Encounter (Signed)
Pt informed

## 2016-08-15 IMAGING — US US SOFT TISSUE HEAD/NECK
1 series · 14 of 25 positions shown · non-contrast
Comparison: CT chest 10/29/2015

CLINICAL DATA: Thyroid nodule

EXAM:
THYROID ULTRASOUND
TECHNIQUE: Ultrasound examination of the thyroid gland and adjacent soft
tissues was performed.

[Series 1: us soft tissue head/neck · 0.05mm/px · 14 of 91 slices shown]
[im 1/91]
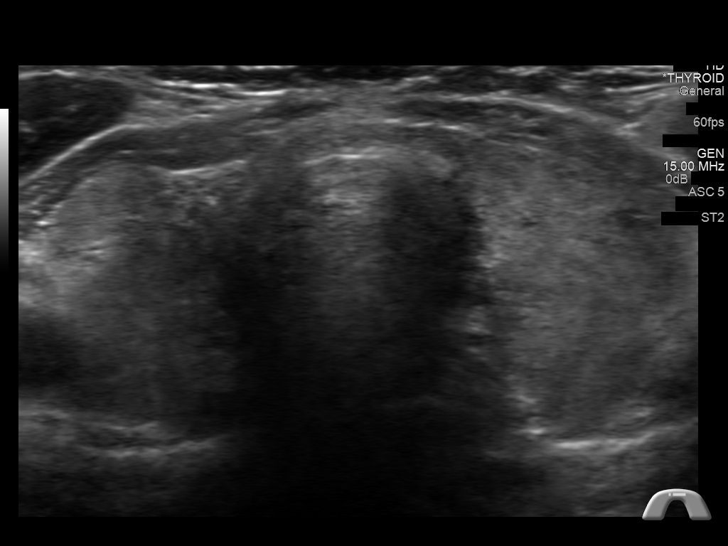
[im 8/91]
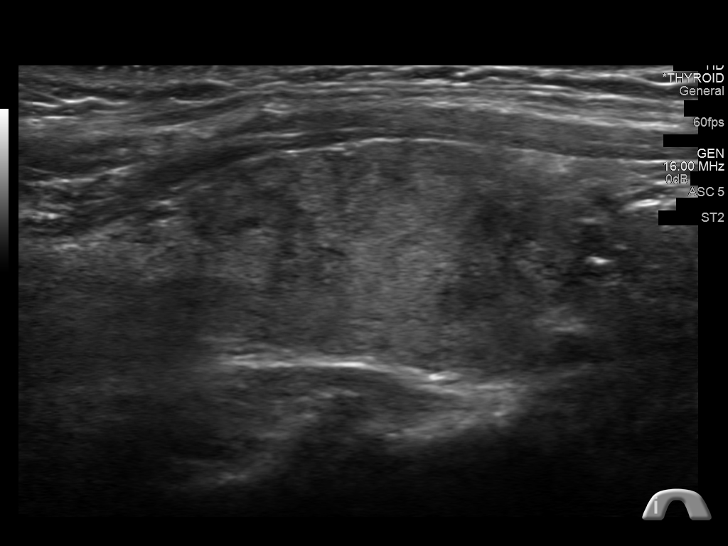
[im 16/91]
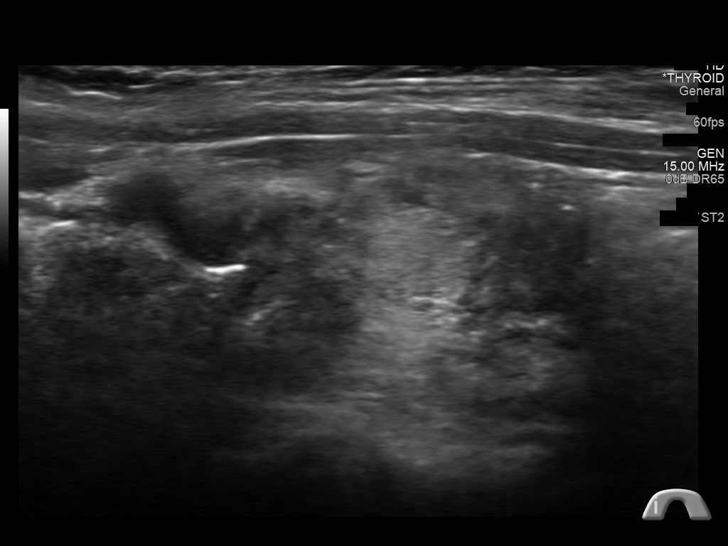
[im 23/91]
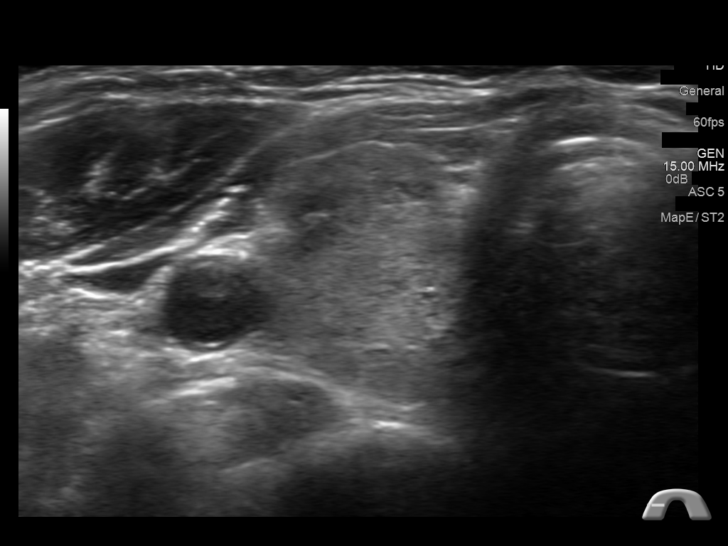
[im 31/91]
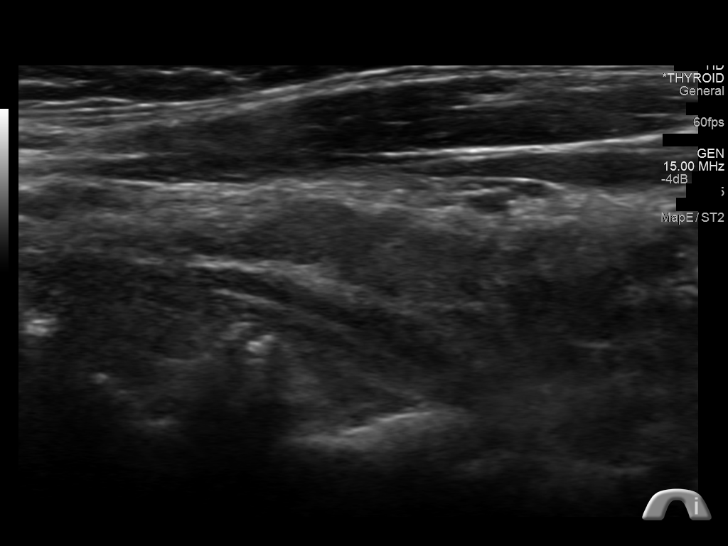
[im 34/91]
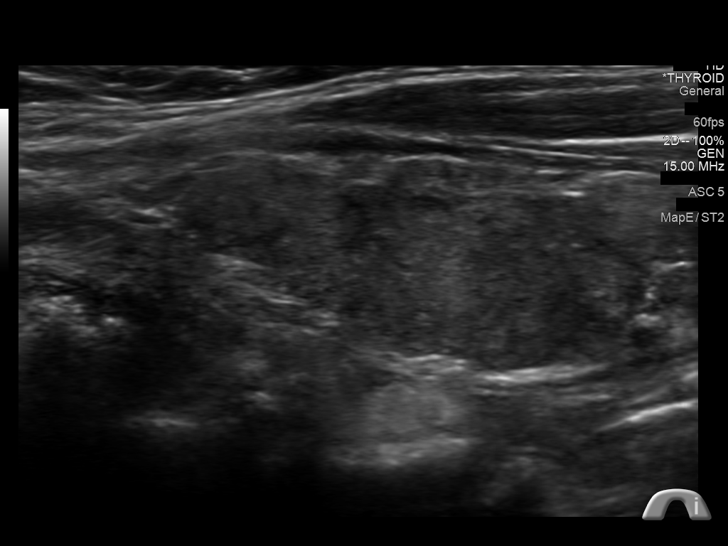
[im 42/91]
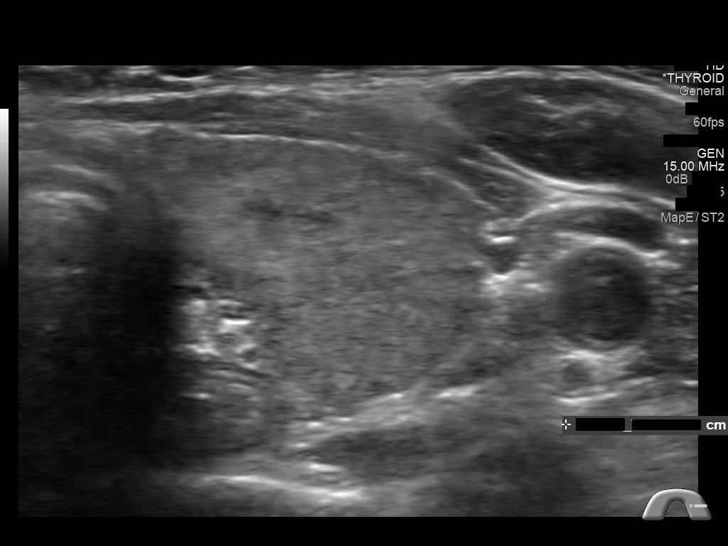
[im 49/91]
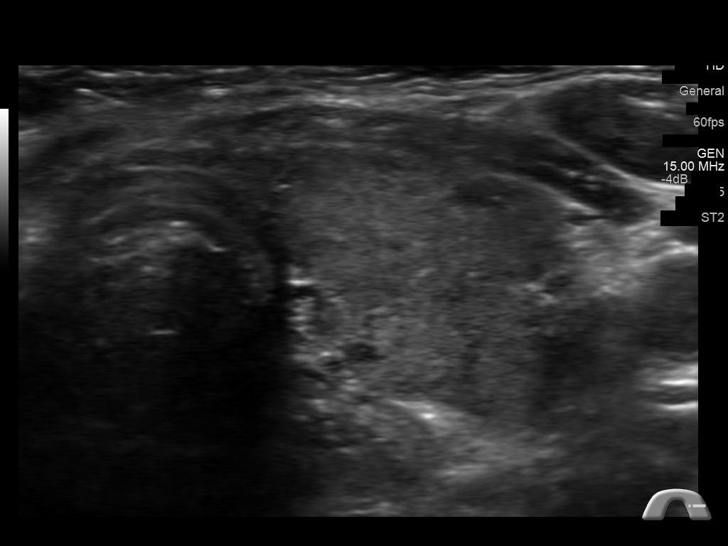
[im 57/91]
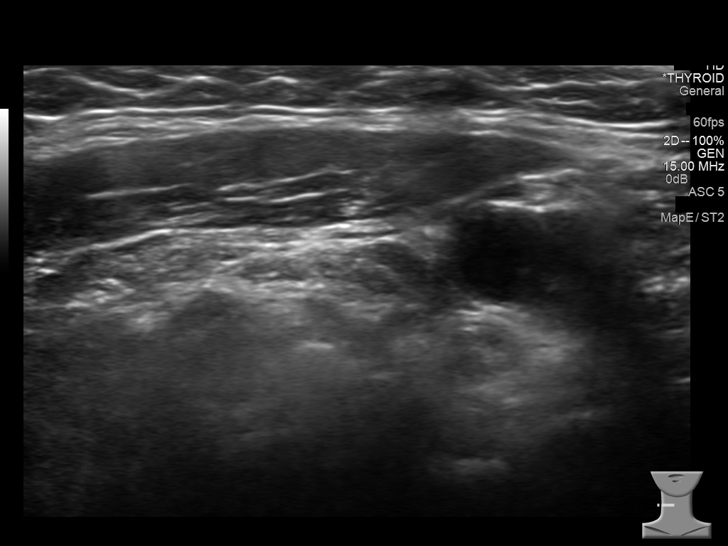
[im 61/91]
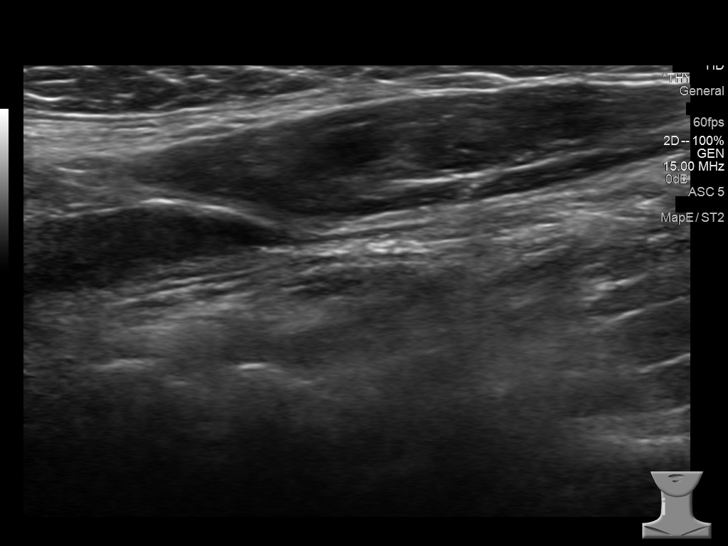
[im 68/91]
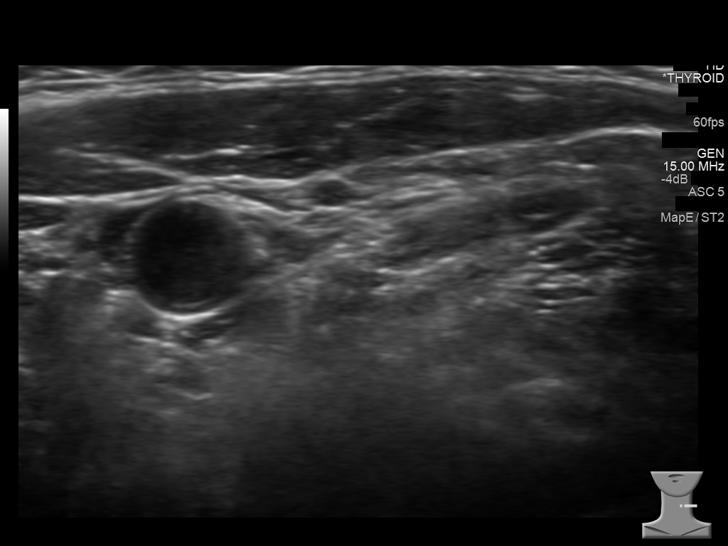
[im 76/91]
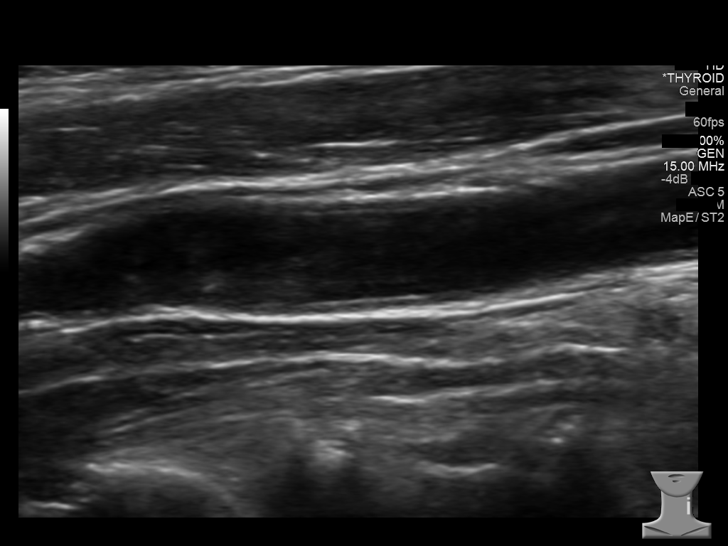
[im 83/91]
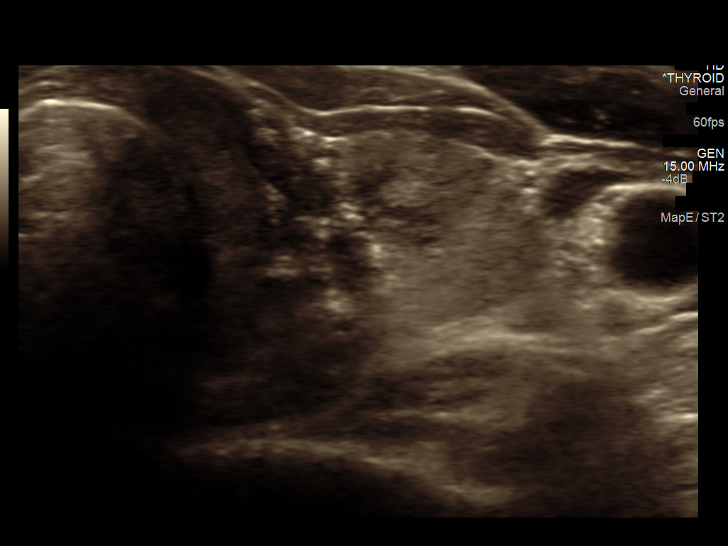
[im 91/91]
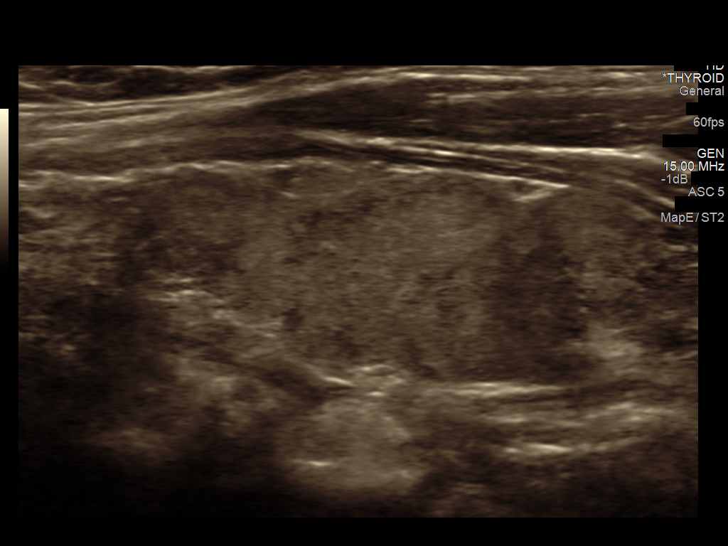

[14 of 25 positions shown; findings below may reference images not displayed]

FINDINGS: Right thyroid lobe

Measurements: 34 x 16 x 16 mm. Inhomogeneous echotexture without
focal lesion.

Left thyroid lobe

Measurements: 37 x 15 x 17 mm. Heterogeneous background echotexture
with a 10 x 9 x 16 mm solid nodule, upper pole.

Isthmus

Thickness: 2 mm.  No nodules visualized.

Lymphadenopathy

None visualized.
IMPRESSION: 1. Normal-sized thyroid with a solitary 16 mm left nodule. Findings
meet consensus criteria for biopsy. Ultrasound-guided fine needle
aspiration should be considered, as per the consensus statement:
Management of Thyroid Nodules Detected at US: Society of
Radiologists in Ultrasound Consensus Conference Statement. Radiology

## 2016-09-06 ENCOUNTER — Telehealth: Payer: Self-pay | Admitting: Family Medicine

## 2016-09-06 NOTE — Telephone Encounter (Signed)
Pt states she was put on Lyrica last Thursday and she is not sure if its to soon to tell, but she does not see any improvement in how she feels. Pt is not sure if her dose needs to be higher. Pt would like a call back. Please advise.

## 2016-09-06 NOTE — Telephone Encounter (Signed)
Mailbox full

## 2016-09-06 NOTE — Telephone Encounter (Signed)
Please schedule patient for office visit to evaluate her symptoms and change therapy if appropriate.

## 2016-09-06 NOTE — Telephone Encounter (Signed)
Patient scheduled appointment for Monday. 

## 2016-09-09 ENCOUNTER — Ambulatory Visit (INDEPENDENT_AMBULATORY_CARE_PROVIDER_SITE_OTHER): Payer: Medicare Other | Admitting: Family Medicine

## 2016-09-09 ENCOUNTER — Encounter: Payer: Self-pay | Admitting: Family Medicine

## 2016-09-09 VITALS — BP 120/76 | HR 72 | Temp 98.2°F | Resp 16 | Ht 62.0 in | Wt 163.0 lb

## 2016-09-09 DIAGNOSIS — Z89511 Acquired absence of right leg below knee: Secondary | ICD-10-CM

## 2016-09-09 DIAGNOSIS — G629 Polyneuropathy, unspecified: Secondary | ICD-10-CM

## 2016-09-09 MED ORDER — PREGABALIN 150 MG PO CAPS: 150.0000 mg | ORAL_CAPSULE | Freq: Two times a day (BID) | ORAL | 0 refills | Status: DC

## 2016-09-09 NOTE — Progress Notes (Signed)
Name: [name]   MRN: [id number]    DOB: July 29, 1951   Date:09/09/2016       Progress Note  Subjective  Chief Complaint  Chief Complaint  Patient presents with  . Follow-up    discuss increasing Lyrica for leg pain    HPI  Pt. Returns for follow up on nerve pain in the right lower extremity, s/P BKA in 2015 for multiple ankle fractures. She reports numbness on the lateral aspect of her stump, burning pain from knee down to her leg. SOme days, she has phantom pain in the area of leg which has been amputated. She was started on Lyrica 75 mg twice daily and reports that actually made her pain worse unable to sleep.  She is requesting an increase in dosage of Lyrica.   Past Medical History:  Diagnosis Date  . Complication of anesthesia   . Headache(784.0)    non post menopausal  . Heart murmur    for years, not heard now  . History of blood transfusion    post back surgeries- '90's  . Hypertension   . Neuropathic bladder   . PONV (postoperative nausea and vomiting)   . Scoliosis   . UTI (lower urinary tract infection)    Dut to neuropathic bladder    Past Surgical History:  Procedure Laterality Date  . AMPUTATION Right 01/13/2014   Procedure: AMPUTATION BELOW RIGHT KNEE;  Surgeon: Toni ArthursJohn Hewitt, MD;  Location: [facility] OR;  Service: Orthopedics;  Laterality: Right;  . ANKLE FUSION Right 2013   tendon release right 2nd toe  . ANTERIOR AND POSTERIOR SPINAL FUSION  1995   L5- S  . ANTERIOR RELEASE VERTEBRAL BODY W/ POSTERIOR FUSION  1991   2 surgeries   . BACK SURGERY    . bone biospy Right    ankle  . BREAST SURGERY Left 1990   biospy non cancer  . CHOLECYSTECTOMY  1991  . DILATION AND CURETTAGE OF UTERUS  2004  . ELBOW WEDGE OSTEOTOMY  2005   L4  . HARDWARE REMOVAL  2006   T5  . Hardware replaced  2005  . HYSTEROSCOPY  2004  . JOINT REPLACEMENT Right 2008   hip  . ORIF METATARSAL FRACTURE Right 2011   1 and 2  . P/P fusion Right 2010   2nd, 3rd, 4th  . REVISION  TOTAL HIP ARTHROPLASTY Right 2011  . seportinoplasty  1980  . TENDON RELEASE Right 2011  . TONSILLECTOMY  1973  . TOTAL HIP ARTHROPLASTY Left 08/02/2015   Procedure: TOTAL HIP ARTHROPLASTY;  Surgeon: Donato HeinzJames P Hooten, MD;  Location: [facility] ORS;  Service: Orthopedics;  Laterality: Left;    Family History  Problem Relation Age of Onset  . Hypertension Mother   . Hypertension Father   . CAD Father   . Hypertension Son   . Obesity Sister   . Obesity Brother   . Hypertension Sister     Social History   Social History  . Marital status: Married    Spouse name: N/A  . Number of children: N/A  . Years of education: N/A   Occupational History  . Not on file.   Social History Main Topics  . Smoking status: Never Smoker  . Smokeless tobacco: Never Used  . Alcohol use No  . Drug use: No  . Sexual activity: Yes   Other Topics Concern  . Not on file   Social History Narrative  . No narrative on file  Current Outpatient Prescriptions:  .  atenolol (TENORMIN) 25 MG tablet, Take 1 tablet (25 mg total) by mouth every morning., Disp: 90 tablet, Rfl: 0 .  conjugated estrogens (PREMARIN) vaginal cream, Pea sized amount to urethra 3 times weekly at bedtime., Disp: 42.5 g, Rfl: 12 .  diphenhydramine-acetaminophen (TYLENOL PM) 25-500 MG TABS, Take 2 tablets by mouth at bedtime., Disp: , Rfl:  .  gabapentin (NEURONTIN) 300 MG capsule, TAKE 2 CAPSULES THREE TIMES DAILY, Disp: 540 capsule, Rfl: 1 .  Incontinence Supply Disposable (BLADDER CONTROL PADS EX ABSORB) MISC, , Disp: , Rfl:  .  pregabalin (LYRICA) 75 MG capsule, Take 1 capsule (75 mg total) by mouth 2 (two) times daily., Disp: 180 capsule, Rfl: 0 .  scopolamine (TRANSDERM-SCOP) 1 MG/3DAYS, Place 1 patch (1.5 mg total) onto the skin every 3 (three) days., Disp: 10 patch, Rfl: 0 .  senna (SENOKOT) 8.6 MG TABS tablet, Take 2 tablets by mouth daily., Disp: , Rfl:  .  triamterene-hydrochlorothiazide (DYAZIDE) 37.5-25 MG capsule, Take 1  each (1 capsule total) by mouth every morning., Disp: 90 capsule, Rfl: 0  Allergies  Allergen Reactions  . Dilaudid [Hydromorphone] Shortness Of Breath    Respiratory arrest Respiratory arrest  . Benzoin Rash  . Other Rash    "Old kind of Adhesive Tape".  . Nsaids     Other reaction(s): Kidney Disorder Renal failure  . Tape Rash     Review of Systems  Neurological: Positive for tingling.     Objective  Vitals:   09/09/16 1603  BP: 120/76  Pulse: 72  Resp: 16  Temp: 98.2 F (36.8 C)  TempSrc: Oral  SpO2: 97%  Weight: 163 lb (73.9 kg)  Height: 5\' 2"  (1.575 m)    Physical Exam  Constitutional: She is oriented to person, place, and time and well-developed, well-nourished, and in no distress.  HENT:  Head: Normocephalic and atraumatic.  Musculoskeletal:  Right Below knee amputation, stump normal in  appearance.   Neurological: She is alert and oriented to person, place, and time.  Psychiatric: Mood, memory, affect and judgment normal.  Nursing note and vitals reviewed.     Assessment & Plan  1. Peripheral polyneuropathy (HCC) Increase Lyrica to 150 mg twice a day, prescription provided follow-up in 3 months - pregabalin (LYRICA) 150 MG capsule; Take 1 capsule (150 mg total) by mouth 2 (two) times daily.  Dispense: 180 capsule; Refill: 0  2. S/P BKA (below knee amputation), right (HCC) Will request paperwork from [employer] to replacement for stump cover.   Emily Forse [name] 09/09/2016 4:07 PM

## 2016-09-26 ENCOUNTER — Ambulatory Visit: Payer: Commercial Managed Care - HMO | Admitting: Urology

## 2016-09-27 ENCOUNTER — Telehealth: Payer: Self-pay | Admitting: Family Medicine

## 2016-09-27 DIAGNOSIS — G629 Polyneuropathy, unspecified: Secondary | ICD-10-CM

## 2016-09-27 NOTE — Telephone Encounter (Signed)
Pt called back stating now she wants to stay on the Lyrica and is out of town and her RX is at [place] house here in [place] and she wants to know if a new one could be called into [facility]. Please advise.

## 2016-09-27 NOTE — Telephone Encounter (Signed)
Pt called back stating now she wants to stay on the Lyrica and is out of town and her RX is at her house here in [place] and she wants to know if a new one could be called into [facility]. Please advise.  Routed to Dr. Sherryll BurgerShah for advice

## 2016-09-27 NOTE — Telephone Encounter (Signed)
Pt stated that has been taking Lyrica that was prescribed at her last visit, which was on 09/09/16.  Pt stated that the Lyrica wasn't working for her and would like to switch back to the Gabapentin, which would need to be sent to her mail order pharmacy.  Pt stated that if she needs to be seen it would have to be after Wednesday of next week due to being out of town.

## 2016-09-30 MED ORDER — PREGABALIN 150 MG PO CAPS
150.0000 mg | ORAL_CAPSULE | Freq: Two times a day (BID) | ORAL | 0 refills | Status: DC
Start: 2016-09-30 — End: 2016-11-19

## 2016-09-30 NOTE — Telephone Encounter (Signed)
Medication has been refilled and ready for patient pick up per Dr. Shah 

## 2016-10-07 ENCOUNTER — Ambulatory Visit (INDEPENDENT_AMBULATORY_CARE_PROVIDER_SITE_OTHER): Payer: Medicare Other | Admitting: Urology

## 2016-10-07 ENCOUNTER — Encounter: Payer: Self-pay | Admitting: Urology

## 2016-10-07 VITALS — BP 92/63 | HR 55 | Ht 62.0 in | Wt 164.0 lb

## 2016-10-07 DIAGNOSIS — N319 Neuromuscular dysfunction of bladder, unspecified: Secondary | ICD-10-CM | POA: Diagnosis not present

## 2016-10-07 DIAGNOSIS — N952 Postmenopausal atrophic vaginitis: Secondary | ICD-10-CM | POA: Diagnosis not present

## 2016-10-07 DIAGNOSIS — R350 Frequency of micturition: Secondary | ICD-10-CM

## 2016-10-07 DIAGNOSIS — Z87440 Personal history of urinary (tract) infections: Secondary | ICD-10-CM | POA: Diagnosis not present

## 2016-10-07 LAB — BLADDER SCAN AMB NON-IMAGING: Scan Result: 91

## 2016-10-07 NOTE — Progress Notes (Signed)
10/07/2016 10:58 AM   [name] [birth year] [id number]  Referring provider: Ellyn HackSyed Asad A Shah, MD [address] [place], [place] [address]  Chief Complaint  Patient presents with  . Follow-up    HPI: Patient is 65 year old Caucasian female with a history of neurogenic bladder due to multiple spinal surgeries with subsequent neuropathy extending from the waist and below and bone degeneration leading to right BKA (2015).  She is cathing herself 2 to 3 times daily.  She has not had any difficultly with cathing.  She is unsure at this time if she needs refills on her catheter supplies.    She is having infrequent nocturia.  She has urge incontinence upon waking in the morning.  She has some episodes of urge incontinence during the day, but these are very infrequent.  She has intermittency and hesitancy when trying to void, but these are baseline.    She is bothered a great deal with accidental loss of urine.  She has downgraded to a lighter incontinence pad which she is pleased about.  She is somewhat bothered by night time urination.    Her PVR today is 91 mL.  She has not had any UTI's since her last visit with us one year ago.   She is applying the vaginal estrogen cream on occasion.    PMH: Past Medical History:  Diagnosis Date  . Complication of anesthesia   . Headache(784.0)    non post menopausal  . Heart murmur    for years, not heard now  . History of blood transfusion    post back surgeries- '90's  . Hypertension   . Neuropathic bladder   . PONV (postoperative nausea and vomiting)   . Scoliosis   . UTI (lower urinary tract infection)    Dut to neuropathic bladder    Surgical History: Past Surgical History:  Procedure Laterality Date  . AMPUTATION Right 01/13/2014   Procedure: AMPUTATION BELOW RIGHT KNEE;  Surgeon: Toni ArthursJohn Hewitt, MD;  Location: [facility];  Service: Orthopedics;  Laterality: Right;  . ANKLE FUSION Right 2013   tendon release right  2nd toe  . ANTERIOR AND POSTERIOR SPINAL FUSION  1995   L5- S  . ANTERIOR RELEASE VERTEBRAL BODY W/ POSTERIOR FUSION  1991   2 surgeries   . BACK SURGERY    . bone biospy Right    ankle  . BREAST SURGERY Left 1990   biospy non cancer  . CHOLECYSTECTOMY  1991  . DILATION AND CURETTAGE OF UTERUS  2004  . ELBOW WEDGE OSTEOTOMY  2005   L4  . HARDWARE REMOVAL  2006   T5  . Hardware replaced  2005  . HYSTEROSCOPY  2004  . JOINT REPLACEMENT Right 2008   hip  . ORIF METATARSAL FRACTURE Right 2011   1 and 2  . P/P fusion Right 2010   2nd, 3rd, 4th  . REVISION TOTAL HIP ARTHROPLASTY Right 2011  . seportinoplasty  1980  . TENDON RELEASE Right 2011  . TONSILLECTOMY  1973  . TOTAL HIP ARTHROPLASTY Left 08/02/2015   Procedure: TOTAL HIP ARTHROPLASTY;  Surgeon: Donato HeinzJames P Hooten, MD;  Location: [facility] ORS;  Service: Orthopedics;  Laterality: Left;    Home Medications:    Medication List       Accurate as of 10/07/16 10:58 AM. Always use your most recent med list.          atenolol 25 MG tablet Commonly known as:  TENORMIN Take  1 tablet (25 mg total) by mouth every morning.   Bladder Control Pads Ex Absorb Misc   conjugated estrogens vaginal cream Commonly known as:  PREMARIN Pea sized amount to urethra 3 times weekly at bedtime.   diphenhydramine-acetaminophen 25-500 MG Tabs tablet Commonly known as:  TYLENOL PM Take 2 tablets by mouth at bedtime.   gabapentin 300 MG capsule Commonly known as:  NEURONTIN TAKE 2 CAPSULES THREE TIMES DAILY   pregabalin 150 MG capsule Commonly known as:  LYRICA Take 1 capsule (150 mg total) by mouth 2 (two) times daily.   scopolamine 1 MG/3DAYS Commonly known as:  TRANSDERM-SCOP Place 1 patch (1.5 mg total) onto the skin every 3 (three) days.   senna 8.6 MG Tabs tablet Commonly known as:  SENOKOT Take 2 tablets by mouth daily.   triamterene-hydrochlorothiazide 37.5-25 MG capsule Commonly known as:  DYAZIDE Take 1 each (1 capsule  total) by mouth every morning.       Allergies:  Allergies  Allergen Reactions  . Dilaudid [Hydromorphone] Shortness Of Breath    Respiratory arrest Respiratory arrest  . Benzoin Rash  . Other Rash    "Old kind of Adhesive Tape".  . Nsaids     Other reaction(s): Kidney Disorder Renal failure  . Tape Rash    Family History: Family History  Problem Relation Age of Onset  . Hypertension Mother   . Hypertension Father   . CAD Father   . Hypertension Son   . Obesity Sister   . Obesity Brother   . Hypertension Sister     Social History:  reports that she has never smoked. She has never used smokeless tobacco. She reports that she does not drink alcohol or use drugs.  ROS: UROLOGY Frequent Urination?: No Hard to postpone urination?: No Burning/pain with urination?: No Get up at night to urinate?: Yes Leakage of urine?: Yes Urine stream starts and stops?: Yes Trouble starting stream?: Yes Do you have to strain to urinate?: No Blood in urine?: No Urinary tract infection?: No Sexually transmitted disease?: No Injury to kidneys or bladder?: No Painful intercourse?: No Weak stream?: No Currently pregnant?: No Vaginal bleeding?: No Last menstrual period?: n  Gastrointestinal Nausea?: No Vomiting?: No Indigestion/heartburn?: No Diarrhea?: No Constipation?: No  Constitutional Fever: No Night sweats?: No Weight loss?: No Fatigue?: No  Skin Skin rash/lesions?: No Itching?: No  Eyes Blurred vision?: No Double vision?: No  Ears/Nose/Throat Sore throat?: No Sinus problems?: No  Hematologic/Lymphatic Swollen glands?: No Easy bruising?: No  Cardiovascular Leg swelling?: No Chest pain?: No  Respiratory Cough?: No Shortness of breath?: No  Endocrine Excessive thirst?: No  Musculoskeletal Back pain?: No Joint pain?: No  Neurological Headaches?: No Dizziness?: No  Psychologic Depression?: No Anxiety?: No  Physical Exam: BP 92/63 (BP  Location: Left Arm, Patient Position: Sitting, Cuff Size: Normal)   Pulse (!) 55   Ht 5\' 2"  (1.575 m)   Wt 164 lb (74.4 kg)   BMI 30.00 kg/m   Constitutional: Well nourished. Alert and oriented, No acute distress. HEENT: [place] AT, moist mucus membranes. Trachea midline, no masses. Cardiovascular: No clubbing, cyanosis, or edema. Respiratory: Normal respiratory effort, no increased work of breathing. GI: Abdomen is soft, non tender, non distended, no abdominal masses. Liver and spleen not palpable.  No hernias appreciated.  Stool sample for occult testing is not indicated.   GU: No CVA tenderness.  No bladder fullness or masses.   Atrophic external genitalia, normal pubic hair distribution, no lesions.  Normal urethral meatus, no lesions, no prolapse, no discharge.   No urethral masses, tenderness and/or tenderness. No bladder fullness, tenderness or masses. Pale vagina mucosa, poor estrogen effect, no discharge, no lesions, good pelvic support, Grade II cystocele is noted.  Rectocele is noted.  No cervical motion tenderness.  Uterus is freely mobile and non-fixed.  No adnexal/parametria masses or tenderness noted.  Anus and perineum are without rashes or lesions.    Skin: No rashes, bruises or suspicious lesions. Lymph: No cervical or inguinal adenopathy. Neurologic: Grossly intact, no focal deficits, moving all 4 extremities. Psychiatric: Normal mood and affect.  Laboratory Data: Lab Results  Component Value Date   WBC 7.2 11/02/2015   HGB 11.7 (L) 10/29/2015   HCT 35.0 11/02/2015   MCV 91 11/02/2015   PLT 385 (H) 11/02/2015    Lab Results  Component Value Date   CREATININE 1.07 (H) 03/06/2016    Lab Results  Component Value Date   TSH 1.270 11/02/2015       Component Value Date/Time   CHOL 276 (H) 03/06/2016 1023   HDL 49 03/06/2016 1023   CHOLHDL 5.6 (H) 03/06/2016 1023   LDLCALC Comment 03/06/2016 1023    Lab Results  Component Value Date   AST 34 10/29/2015   Lab  Results  Component Value Date   ALT 33 10/29/2015     Pertinent Imaging: Results for Stephanie KetGATES, Stephanie F (MRN [id number]) as of 10/07/2016 10:57  Ref. Range 10/07/2016 10:31  Scan Result Unknown 91    Assessment & Plan:    1. Neurogenic bladder  - Patient will continue CIC   - Bladder Scan (Post Void Residual) in office  - RTC in one year for recheck on PVR  2. Vaginal atrophy  - Patient encouraged to use the vaginal estrogen cream three nights weekly  - RTC in one year for exam  3. History of recurrent UTI's  - no UTI's over the last year  - continue preventative measures  - contact our office if develops symptoms of an UTI      Return in about 1 year (around 10/07/2017) for OAB questionnaire and PVR.  These notes generated with voice recognition software. I apologize for typographical errors.  Michiel CowboySHANNON Ilija Maxim, PA-C  [facility] [address], [address] [place], [place] [address] [phone]

## 2016-11-19 ENCOUNTER — Telehealth: Payer: Self-pay | Admitting: Family Medicine

## 2016-11-19 ENCOUNTER — Encounter: Payer: Self-pay | Admitting: Family Medicine

## 2016-11-19 ENCOUNTER — Ambulatory Visit (INDEPENDENT_AMBULATORY_CARE_PROVIDER_SITE_OTHER): Payer: Medicare Other | Admitting: Family Medicine

## 2016-11-19 VITALS — BP 128/74 | HR 69 | Temp 97.8°F | Resp 16 | Ht 62.0 in | Wt 170.3 lb

## 2016-11-19 DIAGNOSIS — G629 Polyneuropathy, unspecified: Secondary | ICD-10-CM | POA: Diagnosis not present

## 2016-11-19 DIAGNOSIS — Z89511 Acquired absence of right leg below knee: Secondary | ICD-10-CM | POA: Diagnosis not present

## 2016-11-19 MED ORDER — PREGABALIN 150 MG PO CAPS: 150.0000 mg | ORAL_CAPSULE | Freq: Two times a day (BID) | ORAL | 0 refills | Status: DC

## 2016-11-19 NOTE — Telephone Encounter (Signed)
COMPLETED

## 2016-11-19 NOTE — Progress Notes (Signed)
Name: [name]   MRN: [id number]    DOB: 09-10-1951   Date:11/19/2016       Progress Note  Subjective  Chief Complaint  Chief Complaint  Patient presents with  . Follow-up    3 month recheck med refills    HPI  Peripheral Neuropathy: Pt. Presents for medication refills on Lyrica, now on 150 mg twice daily for nerve pain in the right stump, pain is improved since being on the higher dose of Lyrica, no side effects reported except maybe more drowsy with the higher dose.     Past Medical History:  Diagnosis Date  . Complication of anesthesia   . Headache(784.0)    non post menopausal  . Heart murmur    for years, not heard now  . History of blood transfusion    post back surgeries- '90's  . Hypertension   . Neuropathic bladder   . PONV (postoperative nausea and vomiting)   . Scoliosis   . UTI (lower urinary tract infection)    Dut to neuropathic bladder    Past Surgical History:  Procedure Laterality Date  . AMPUTATION Right 01/13/2014   Procedure: AMPUTATION BELOW RIGHT KNEE;  Surgeon: Toni ArthursJohn Hewitt, MD;  Location: [facility];  Service: Orthopedics;  Laterality: Right;  . ANKLE FUSION Right 2013   tendon release right 2nd toe  . ANTERIOR AND POSTERIOR SPINAL FUSION  1995   L5- S  . ANTERIOR RELEASE VERTEBRAL BODY W/ POSTERIOR FUSION  1991   2 surgeries   . BACK SURGERY    . bone biospy Right    ankle  . BREAST SURGERY Left 1990   biospy non cancer  . CHOLECYSTECTOMY  1991  . DILATION AND CURETTAGE OF UTERUS  2004  . ELBOW WEDGE OSTEOTOMY  2005   L4  . HARDWARE REMOVAL  2006   T5  . Hardware replaced  2005  . HYSTEROSCOPY  2004  . JOINT REPLACEMENT Right 2008   hip  . ORIF METATARSAL FRACTURE Right 2011   1 and 2  . P/P fusion Right 2010   2nd, 3rd, 4th  . REVISION TOTAL HIP ARTHROPLASTY Right 2011  . seportinoplasty  1980  . TENDON RELEASE Right 2011  . TONSILLECTOMY  1973  . TOTAL HIP ARTHROPLASTY Left 08/02/2015   Procedure: TOTAL HIP ARTHROPLASTY;   Surgeon: Donato HeinzJames P Hooten, MD;  Location: [facility] ORS;  Service: Orthopedics;  Laterality: Left;    Family History  Problem Relation Age of Onset  . Hypertension Mother   . Hypertension Father   . CAD Father   . Hypertension Son   . Obesity Sister   . Obesity Brother   . Hypertension Sister     Social History   Social History  . Marital status: Married    Spouse name: N/A  . Number of children: N/A  . Years of education: N/A   Occupational History  . Not on file.   Social History Main Topics  . Smoking status: Never Smoker  . Smokeless tobacco: Never Used  . Alcohol use No  . Drug use: No  . Sexual activity: Yes   Other Topics Concern  . Not on file   Social History Narrative  . No narrative on file     Current Outpatient Prescriptions:  .  atenolol (TENORMIN) 25 MG tablet, Take 1 tablet (25 mg total) by mouth every morning., Disp: 90 tablet, Rfl: 0 .  conjugated estrogens (PREMARIN) vaginal cream, Pea sized amount to urethra  3 times weekly at bedtime., Disp: 42.5 g, Rfl: 12 .  diphenhydramine-acetaminophen (TYLENOL PM) 25-500 MG TABS, Take 2 tablets by mouth at bedtime., Disp: , Rfl:  .  gabapentin (NEURONTIN) 300 MG capsule, TAKE 2 CAPSULES THREE TIMES DAILY, Disp: 540 capsule, Rfl: 1 .  Incontinence Supply Disposable (BLADDER CONTROL PADS EX ABSORB) MISC, , Disp: , Rfl:  .  pregabalin (LYRICA) 150 MG capsule, Take 1 capsule (150 mg total) by mouth 2 (two) times daily., Disp: 180 capsule, Rfl: 0 .  scopolamine (TRANSDERM-SCOP) 1 MG/3DAYS, Place 1 patch (1.5 mg total) onto the skin every 3 (three) days., Disp: 10 patch, Rfl: 0 .  senna (SENOKOT) 8.6 MG TABS tablet, Take 2 tablets by mouth daily., Disp: , Rfl:  .  triamterene-hydrochlorothiazide (DYAZIDE) 37.5-25 MG capsule, Take 1 each (1 capsule total) by mouth every morning., Disp: 90 capsule, Rfl: 0  Allergies  Allergen Reactions  . Dilaudid [Hydromorphone] Shortness Of Breath    Respiratory arrest Respiratory  arrest  . Benzoin Rash  . Other Rash    "Old kind of Adhesive Tape".  . Nsaids     Other reaction(s): Kidney Disorder Renal failure  . Tape Rash     Review of Systems  Neurological: Positive for tingling.     Objective  Vitals:   11/19/16 0909  BP: 128/74  Pulse: 69  Resp: 16  Temp: 97.8 F (36.6 C)  TempSrc: Oral  SpO2: 95%  Weight: 170 lb 4.8 oz (77.2 kg)  Height: 5\' 2"  (1.575 m)    Physical Exam  Constitutional: She is oriented to person, place, and time and well-developed, well-nourished, and in no distress.  Cardiovascular: Normal rate, regular rhythm and normal heart sounds.   No murmur heard. Pulmonary/Chest: Effort normal and breath sounds normal.  Musculoskeletal:  Prosthesis on right lower leg, s/p right BKA.  Pain described as lightning bolt sensation at the level of the stump an extending down into the right lower leg.  Neurological: She is alert and oriented to person, place, and time.  Nursing note and vitals reviewed.      Assessment & Plan  1. Peripheral polyneuropathy (HCC) Improved on the higher dose of Lyrica, continue with present management. Refills provided - pregabalin (LYRICA) 150 MG capsule; Take 1 capsule (150 mg total) by mouth 2 (two) times daily.  Dispense: 180 capsule; Refill: 0   2. S/P unilateral BKA (below knee amputation), right (HCC)   [name] [name] [name] [facility] 11/19/2016 9:32 AM

## 2016-11-26 ENCOUNTER — Telehealth: Payer: Self-pay | Admitting: Family Medicine

## 2016-11-26 NOTE — Telephone Encounter (Signed)
Pt is requesting refill on atenolol, lyrica and triamterene-hydrochlorothiazide. Please send to new pharmacy [facility].  There phone number is [phone]  her ID #: [id number].  This is not an emergency but will need it by the end of the month. They will need her name, id # and her date of birth. Pt states that [employer] has not refilled the Lyrica

## 2016-12-05 ENCOUNTER — Other Ambulatory Visit: Payer: Self-pay | Admitting: Emergency Medicine

## 2016-12-05 DIAGNOSIS — I10 Essential (primary) hypertension: Secondary | ICD-10-CM

## 2016-12-05 DIAGNOSIS — G629 Polyneuropathy, unspecified: Secondary | ICD-10-CM

## 2016-12-05 MED ORDER — PREGABALIN 150 MG PO CAPS: 150.0000 mg | ORAL_CAPSULE | Freq: Two times a day (BID) | ORAL | 0 refills | Status: DC

## 2016-12-05 MED ORDER — TRIAMTERENE-HCTZ 37.5-25 MG PO CAPS: 1.0000 | ORAL_CAPSULE | Freq: Every morning | ORAL | 0 refills | Status: DC

## 2016-12-05 MED ORDER — ATENOLOL 25 MG PO TABS
25.0000 mg | ORAL_TABLET | Freq: Every morning | ORAL | 0 refills | Status: DC
Start: 2016-12-05 — End: 2017-02-27

## 2016-12-05 NOTE — Telephone Encounter (Signed)
Scripts [facility]

## 2017-02-17 ENCOUNTER — Ambulatory Visit: Payer: Medicare Other

## 2017-02-17 ENCOUNTER — Ambulatory Visit: Payer: Medicare Other | Admitting: Family Medicine

## 2017-02-18 ENCOUNTER — Ambulatory Visit: Payer: Medicare Other | Admitting: Family Medicine

## 2017-02-20 ENCOUNTER — Ambulatory Visit: Payer: Medicare Other | Admitting: Family Medicine

## 2017-02-27 ENCOUNTER — Encounter: Payer: Self-pay | Admitting: Family Medicine

## 2017-02-27 ENCOUNTER — Ambulatory Visit (INDEPENDENT_AMBULATORY_CARE_PROVIDER_SITE_OTHER): Payer: Medicare Other | Admitting: Family Medicine

## 2017-02-27 VITALS — BP 128/70 | HR 63 | Temp 97.7°F | Resp 16 | Ht 62.0 in | Wt 167.4 lb

## 2017-02-27 DIAGNOSIS — I10 Essential (primary) hypertension: Secondary | ICD-10-CM

## 2017-02-27 DIAGNOSIS — E782 Mixed hyperlipidemia: Secondary | ICD-10-CM

## 2017-02-27 DIAGNOSIS — G629 Polyneuropathy, unspecified: Secondary | ICD-10-CM

## 2017-02-27 DIAGNOSIS — E785 Hyperlipidemia, unspecified: Secondary | ICD-10-CM | POA: Insufficient documentation

## 2017-02-27 MED ORDER — TRIAMTERENE-HCTZ 37.5-25 MG PO CAPS: 1.0000 | ORAL_CAPSULE | Freq: Every morning | ORAL | 0 refills | Status: DC

## 2017-02-27 MED ORDER — ATENOLOL 25 MG PO TABS: 25.0000 mg | ORAL_TABLET | Freq: Every morning | ORAL | 0 refills | Status: DC

## 2017-02-27 MED ORDER — PREGABALIN 150 MG PO CAPS: 150.0000 mg | ORAL_CAPSULE | Freq: Two times a day (BID) | ORAL | 0 refills | Status: DC

## 2017-02-27 NOTE — Progress Notes (Signed)
Name: [name]   MRN: [id number]    DOB: 05/16/1951   Date:02/27/2017       Progress Note  Subjective  Chief Complaint  Chief Complaint  Patient presents with  . Follow-up    3 month recheck    Hypertension  This is a chronic problem. The problem is unchanged. The problem is controlled. Pertinent negatives include no blurred vision, chest pain, headaches, orthopnea, palpitations or shortness of breath. Past treatments include beta blockers and diuretics. There is no history of kidney disease, CAD/MI or CVA.  Hyperlipidemia  This is a chronic problem. The problem is uncontrolled. Recent lipid tests were reviewed and are high. Pertinent negatives include no chest pain or shortness of breath. She is currently on no antihyperlipidemic treatment. Risk factors for coronary artery disease include dyslipidemia.   Peripheral Neuropathy:  Patient has numbness and burning in the prosthesis in the right lower leg, has worsening pain as the day goes by, worse at night. She describes the sensation as being 'zapped by an electrical outlet', takes Lyrica 150 mg BID, seems to have some relief. Recently has also started using Hemp oil which works marginally.  She reports Lyrica is causing somnolence and may be impacting her short term memory.     Past Medical History:  Diagnosis Date  . Complication of anesthesia   . Headache(784.0)    non post menopausal  . Heart murmur    for years, not heard now  . History of blood transfusion    post back surgeries- '90's  . Hypertension   . Neuropathic bladder   . PONV (postoperative nausea and vomiting)   . Scoliosis   . UTI (lower urinary tract infection)    Dut to neuropathic bladder    Past Surgical History:  Procedure Laterality Date  . AMPUTATION Right 01/13/2014   Procedure: AMPUTATION BELOW RIGHT KNEE;  Surgeon: Toni Arthurs, MD;  Location: [facility] OR;  Service: Orthopedics;  Laterality: Right;  . ANKLE FUSION Right 2013   tendon release right 2nd  toe  . ANTERIOR AND POSTERIOR SPINAL FUSION  1995   L5- S  . ANTERIOR RELEASE VERTEBRAL BODY W/ POSTERIOR FUSION  1991   2 surgeries   . BACK SURGERY    . bone biospy Right    ankle  . BREAST SURGERY Left 1990   biospy non cancer  . CHOLECYSTECTOMY  1991  . DILATION AND CURETTAGE OF UTERUS  2004  . ELBOW WEDGE OSTEOTOMY  2005   L4  . HARDWARE REMOVAL  2006   T5  . Hardware replaced  2005  . HYSTEROSCOPY  2004  . JOINT REPLACEMENT Right 2008   hip  . ORIF METATARSAL FRACTURE Right 2011   1 and 2  . P/P fusion Right 2010   2nd, 3rd, 4th  . REVISION TOTAL HIP ARTHROPLASTY Right 2011  . seportinoplasty  1980  . TENDON RELEASE Right 2011  . TONSILLECTOMY  1973  . TOTAL HIP ARTHROPLASTY Left 08/02/2015   Procedure: TOTAL HIP ARTHROPLASTY;  Surgeon: Donato Heinz, MD;  Location: [facility] ORS;  Service: Orthopedics;  Laterality: Left;    Family History  Problem Relation Age of Onset  . Hypertension Mother   . Hypertension Father   . CAD Father   . Hypertension Son   . Obesity Sister   . Obesity Brother   . Hypertension Sister     Social History   Social History  . Marital status: Married    Spouse name:  N/A  . Number of children: N/A  . Years of education: N/A   Occupational History  . Not on file.   Social History Main Topics  . Smoking status: Never Smoker  . Smokeless tobacco: Never Used  . Alcohol use No  . Drug use: No  . Sexual activity: Yes   Other Topics Concern  . Not on file   Social History Narrative  . No narrative on file     Current Outpatient Prescriptions:  .  atenolol (TENORMIN) 25 MG tablet, Take 1 tablet (25 mg total) by mouth every morning., Disp: 90 tablet, Rfl: 0 .  conjugated estrogens (PREMARIN) vaginal cream, Pea sized amount to urethra 3 times weekly at bedtime., Disp: 42.5 g, Rfl: 12 .  diphenhydramine-acetaminophen (TYLENOL PM) 25-500 MG TABS, Take 2 tablets by mouth at bedtime., Disp: , Rfl:  .  gabapentin (NEURONTIN) 300 MG  capsule, TAKE 2 CAPSULES THREE TIMES DAILY, Disp: 540 capsule, Rfl: 1 .  Incontinence Supply Disposable (BLADDER CONTROL PADS EX ABSORB) MISC, , Disp: , Rfl:  .  pregabalin (LYRICA) 150 MG capsule, Take 1 capsule (150 mg total) by mouth 2 (two) times daily., Disp: 180 capsule, Rfl: 0 .  scopolamine (TRANSDERM-SCOP) 1 MG/3DAYS, Place 1 patch (1.5 mg total) onto the skin every 3 (three) days., Disp: 10 patch, Rfl: 0 .  senna (SENOKOT) 8.6 MG TABS tablet, Take 2 tablets by mouth daily., Disp: , Rfl:  .  triamterene-hydrochlorothiazide (DYAZIDE) 37.5-25 MG capsule, Take 1 each (1 capsule total) by mouth every morning., Disp: 90 capsule, Rfl: 0  Allergies  Allergen Reactions  . Dilaudid [Hydromorphone] Shortness Of Breath    Respiratory arrest Respiratory arrest  . Benzoin Rash  . Other Rash    "Old kind of Adhesive Tape".  . Nsaids     Other reaction(s): Kidney Disorder Renal failure  . Tape Rash     Review of Systems  Eyes: Negative for blurred vision.  Respiratory: Negative for shortness of breath.   Cardiovascular: Negative for chest pain, palpitations and orthopnea.  Neurological: Negative for headaches.      Objective  Vitals:   02/27/17 1021  BP: 128/70  Pulse: 63  Resp: 16  Temp: 97.7 F (36.5 C)  TempSrc: Oral  SpO2: 96%  Weight: 167 lb 6.4 oz (75.9 kg)  Height:  (1.575 m)    Physical Exam  Constitutional: She is oriented to person, place, and time and well-developed, well-nourished, and in no distress.  Cardiovascular: Normal rate, regular rhythm and normal heart sounds.   No murmur heard. Pulmonary/Chest: Effort normal and breath sounds normal.  Musculoskeletal:  Prosthesis on right lower leg, s/p right BKA.  Pain described as lightning bolt sensation at the level of the stump an extending down into the right lower leg.  Neurological: She is alert and oriented to person, place, and time.  Psychiatric: Mood, memory, affect and judgment normal.   Nursing note and vitals reviewed.     Assessment & Plan  1. Essential hypertension BP stable on present antihypertensive therapy - atenolol (TENORMIN) 25 MG tablet; Take 1 tablet (25 mg total) by mouth every morning.  Dispense: 90 tablet; Refill: 0 - triamterene-hydrochlorothiazide (DYAZIDE) 37.5-25 MG capsule; Take 1 each (1 capsule total) by mouth every morning.  Dispense: 90 capsule; Refill: 0  2. Peripheral polyneuropathy (HCC) Continue on Lyrica 150 mg twice a day as prescribed - pregabalin (LYRICA) 150 MG capsule; Take 1 capsule (150 mg total) by mouth 2 (two) times  daily.  Dispense: 180 capsule; Refill: 0  3. Mixed hyperlipidemia Obtain FLP, consider starting on statin therapy - COMPLETE METABOLIC PANEL WITH GFR - Lipid panel   Syed [name] Faylene Kurt[facility] 02/27/2017 10:29 AM

## 2017-03-27 ENCOUNTER — Other Ambulatory Visit: Payer: Self-pay | Admitting: Family Medicine

## 2017-03-27 ENCOUNTER — Telehealth: Payer: Self-pay | Admitting: Family Medicine

## 2017-03-27 DIAGNOSIS — G629 Polyneuropathy, unspecified: Secondary | ICD-10-CM

## 2017-03-27 DIAGNOSIS — I10 Essential (primary) hypertension: Secondary | ICD-10-CM

## 2017-03-27 MED ORDER — TRIAMTERENE-HCTZ 37.5-25 MG PO CAPS: 1.0000 | ORAL_CAPSULE | Freq: Every morning | ORAL | 0 refills | Status: DC

## 2017-03-27 MED ORDER — PREGABALIN 150 MG PO CAPS: 150.0000 mg | ORAL_CAPSULE | Freq: Two times a day (BID) | ORAL | 0 refills | Status: DC

## 2017-03-27 MED ORDER — ATENOLOL 25 MG PO TABS: 25.0000 mg | ORAL_TABLET | Freq: Every morning | ORAL | 0 refills | Status: DC

## 2017-03-27 NOTE — Telephone Encounter (Signed)
Pt said that her [facility] told her that it would take 7 to 10 days before they would send a request to the dr to request them. Pt said that when she was her in April that she thought you all had requested those but it did not get done. Pt is leaving to go across country on the 10 of May and she is needing [facility] .

## 2017-03-27 NOTE — Telephone Encounter (Signed)
Medication was previously sent to wrong pharmacy it has now been corrected and sent to correct pharmacy [facility]

## 2017-03-31 ENCOUNTER — Telehealth: Payer: Self-pay | Admitting: Family Medicine

## 2017-03-31 NOTE — Telephone Encounter (Signed)
Pt said that she is having a problem with getting the LYRICA. She said that they needed more information. Please check and see what is going on with this medication for she will be leaving to go out of town and this is the one that she says that she needs the most. The patient said that it looks like all her other medications are on the way and they will be leaving Friday and this is mail order.

## 2017-04-02 NOTE — Telephone Encounter (Signed)
Spoke with representative from [facility] and she states that medication Lyrica has been shipped on 03/31/2017 and patient will receive medication on 04/02/2017 by 8:00pm, patient has been notified

## 2017-05-25 ENCOUNTER — Other Ambulatory Visit: Payer: Self-pay | Admitting: Family Medicine

## 2017-05-25 DIAGNOSIS — I10 Essential (primary) hypertension: Secondary | ICD-10-CM

## 2017-06-12 ENCOUNTER — Telehealth: Payer: Self-pay | Admitting: Family Medicine

## 2017-06-12 NOTE — Telephone Encounter (Signed)
Dr. Sherryll BurgerShah,  Pt is requesting refill on lyrica. Please send to [facility]. She does have a couple weeks worth left however she would like for you to send it to mail order that way she does not run out before she receives it. Pt also states she was seen in April.

## 2017-06-12 NOTE — Telephone Encounter (Signed)
She will need to return for an office visit appointment to discuss the medication and obtain refills, was last seen 3 months ago. Please schedule for an appointment

## 2017-06-12 NOTE — Telephone Encounter (Signed)
Pt is requesting refill on lyrica. Please send to [facility]. She does have a couple weeks worth left however she would like for you to send it to mail order that way she does not run out before she receives it. Pt also states she was seen in April.

## 2017-06-12 NOTE — Telephone Encounter (Signed)
Returned call and explained that patient must be seen at a minimum every 3 months for any controlled substances prescriptions and she will contact the front desk tomorrow morning to schedule an appointment.

## 2017-06-13 NOTE — Telephone Encounter (Signed)
Patient has scheduled medication refill appointment for 06/16/2017

## 2017-06-16 ENCOUNTER — Ambulatory Visit (INDEPENDENT_AMBULATORY_CARE_PROVIDER_SITE_OTHER): Payer: Medicare Other | Admitting: Family Medicine

## 2017-06-16 ENCOUNTER — Encounter: Payer: Self-pay | Admitting: Family Medicine

## 2017-06-16 VITALS — BP 124/68 | HR 67 | Temp 97.9°F | Resp 16 | Ht 62.0 in | Wt 166.4 lb

## 2017-06-16 DIAGNOSIS — G629 Polyneuropathy, unspecified: Secondary | ICD-10-CM

## 2017-06-16 DIAGNOSIS — I10 Essential (primary) hypertension: Secondary | ICD-10-CM

## 2017-06-16 DIAGNOSIS — E782 Mixed hyperlipidemia: Secondary | ICD-10-CM

## 2017-06-16 MED ORDER — PREGABALIN 150 MG PO CAPS: 150.0000 mg | ORAL_CAPSULE | Freq: Two times a day (BID) | ORAL | 0 refills | Status: DC

## 2017-06-16 MED ORDER — ATENOLOL 25 MG PO TABS: 25.0000 mg | ORAL_TABLET | Freq: Every morning | ORAL | 0 refills | Status: DC

## 2017-06-16 MED ORDER — TRIAMTERENE-HCTZ 37.5-25 MG PO CAPS: ORAL_CAPSULE | ORAL | 0 refills | Status: AC

## 2017-06-16 NOTE — Progress Notes (Signed)
Name: [name]   MRN: [id number]    DOB: Apr 09, 1951   Date:06/16/2017       Progress Note  Subjective  Chief Complaint  Chief Complaint  Patient presents with  . Medication Refill    Hypertension  This is a chronic problem. The problem is unchanged. The problem is controlled. Pertinent negatives include no blurred vision, chest pain, headaches, orthopnea, palpitations or shortness of breath. Past treatments include beta blockers and diuretics. There is no history of kidney disease, CAD/MI or CVA.  Hyperlipidemia  This is a chronic problem. The problem is uncontrolled. Recent lipid tests were reviewed and are high. Pertinent negatives include no chest pain or shortness of breath. She is currently on no antihyperlipidemic treatment. Risk factors for coronary artery disease include dyslipidemia.   Patient is also requesting refill on Lyrica 50 mg twice a day for peripheral neuropathy, described as burning and stinging sensation in right and left lower extremities, she is status post multiple back surgeries, which is thought to be responsible for neuropathy, Lyrica seems to work well for her  Past Medical History:  Diagnosis Date  . Complication of anesthesia   . Headache(784.0)    non post menopausal  . Heart murmur    for years, not heard now  . History of blood transfusion    post back surgeries- '90's  . Hypertension   . Neuropathic bladder   . PONV (postoperative nausea and vomiting)   . Scoliosis   . UTI (lower urinary tract infection)    Dut to neuropathic bladder    Past Surgical History:  Procedure Laterality Date  . AMPUTATION Right 01/13/2014   Procedure: AMPUTATION BELOW RIGHT KNEE;  Surgeon: Toni Arthurs, MD;  Location: [facility] OR;  Service: Orthopedics;  Laterality: Right;  . ANKLE FUSION Right 2013   tendon release right 2nd toe  . ANTERIOR AND POSTERIOR SPINAL FUSION  1995   L5- S  . ANTERIOR RELEASE VERTEBRAL BODY W/ POSTERIOR FUSION  1991   2 surgeries   . BACK  SURGERY    . bone biospy Right    ankle  . BREAST SURGERY Left 1990   biospy non cancer  . CHOLECYSTECTOMY  1991  . DILATION AND CURETTAGE OF UTERUS  2004  . ELBOW WEDGE OSTEOTOMY  2005   L4  . HARDWARE REMOVAL  2006   T5  . Hardware replaced  2005  . HYSTEROSCOPY  2004  . JOINT REPLACEMENT Right 2008   hip  . ORIF METATARSAL FRACTURE Right 2011   1 and 2  . P/P fusion Right 2010   2nd, 3rd, 4th  . REVISION TOTAL HIP ARTHROPLASTY Right 2011  . seportinoplasty  1980  . TENDON RELEASE Right 2011  . TONSILLECTOMY  1973  . TOTAL HIP ARTHROPLASTY Left 08/02/2015   Procedure: TOTAL HIP ARTHROPLASTY;  Surgeon: Donato Heinz, MD;  Location: [facility] ORS;  Service: Orthopedics;  Laterality: Left;    Family History  Problem Relation Age of Onset  . Hypertension Mother   . Hypertension Father   . CAD Father   . Hypertension Son   . Obesity Sister   . Obesity Brother   . Hypertension Sister     Social History   Social History  . Marital status: Married    Spouse name: N/A  . Number of children: N/A  . Years of education: N/A   Occupational History  . Not on file.   Social History Main Topics  . Smoking status:  Never Smoker  . Smokeless tobacco: Never Used  . Alcohol use No  . Drug use: No  . Sexual activity: Yes   Other Topics Concern  . Not on file   Social History Narrative  . No narrative on file     Current Outpatient Prescriptions:  .  atenolol (TENORMIN) 25 MG tablet, TAKE 1 TABLET DAILY IN THE MORNING, Disp: 90 tablet, Rfl: 0 .  atenolol (TENORMIN) 25 MG tablet, TAKE 1 TABLET EVERY MORNING, Disp: 90 tablet, Rfl: 0 .  conjugated estrogens (PREMARIN) vaginal cream, Pea sized amount to urethra 3 times weekly at bedtime., Disp: 42.5 g, Rfl: 12 .  diphenhydramine-acetaminophen (TYLENOL PM) 25-500 MG TABS, Take 2 tablets by mouth at bedtime., Disp: , Rfl:  .  Incontinence Supply Disposable (BLADDER CONTROL PADS EX ABSORB) MISC, , Disp: , Rfl:  .  pregabalin  (LYRICA) 150 MG capsule, Take 1 capsule (150 mg total) by mouth 2 (two) times daily., Disp: 180 capsule, Rfl: 0 .  scopolamine (TRANSDERM-SCOP) 1 MG/3DAYS, Place 1 patch (1.5 mg total) onto the skin every 3 (three) days., Disp: 10 patch, Rfl: 0 .  senna (SENOKOT) 8.6 MG TABS tablet, Take 2 tablets by mouth daily., Disp: , Rfl:  .  triamterene-hydrochlorothiazide (DYAZIDE) 37.5-25 MG capsule, TAKE 1 TABLET EVERY MORNING, Disp: 90 capsule, Rfl: 0 .  triamterene-hydrochlorothiazide (DYAZIDE) 37.5-25 MG capsule, TAKE 1 CAPSULE EVERY       MORNING, Disp: 90 capsule, Rfl: 0  Allergies  Allergen Reactions  . Dilaudid [Hydromorphone] Shortness Of Breath    Respiratory arrest Respiratory arrest  . Benzoin Rash  . Other Rash    "Old kind of Adhesive Tape".  . Nsaids     Other reaction(s): Kidney Disorder Renal failure  . Tape Rash     Review of Systems  Eyes: Negative for blurred vision.  Respiratory: Negative for shortness of breath.   Cardiovascular: Negative for chest pain, palpitations and orthopnea.  Neurological: Negative for headaches.      Objective  Vitals:   06/16/17 0838  BP: 124/68  Pulse: 67  Resp: 16  Temp: 97.9 F (36.6 C)  TempSrc: Oral  SpO2: 96%  Weight: 166 lb 6.4 oz (75.5 kg)  Height: 5\' 2"  (1.575 m)    Physical Exam  Constitutional: She is oriented to person, place, and time and well-developed, well-nourished, and in no distress.  HENT:  Head: Normocephalic and atraumatic.  Cardiovascular: Normal rate, regular rhythm and normal heart sounds.   No murmur heard. Pulmonary/Chest: Effort normal and breath sounds normal.  Abdominal: Soft. Bowel sounds are normal. There is no tenderness.  Musculoskeletal:  Prosthesis on right lower leg, s/p right BKA.  Pain described as lightning bolt sensation at the level of the stump an extending down into the right lower extremity  Neurological: She is alert and oriented to person, place, and time.  Psychiatric: Mood,  memory, affect and judgment normal.  Nursing note and vitals reviewed.    Assessment & Plan  1. Peripheral polyneuropathy Continue Lyrica as prescribed, we have evaluated in 3 months - pregabalin (LYRICA) 150 MG capsule; Take 1 capsule (150 mg total) by mouth 2 (two) times daily.  Dispense: 180 capsule; Refill: 0  2. Essential hypertension BP stable on present and hypertensive treatment - triamterene-hydrochlorothiazide (DYAZIDE) 37.5-25 MG capsule; TAKE 1 TABLET EVERY MORNING  Dispense: 90 capsule; Refill: 0 - atenolol (TENORMIN) 25 MG tablet; Take 1 tablet (25 mg total) by mouth every morning.  Dispense: 90 tablet; Refill:  0  3. Mixed hyperlipidemia  - Lipid panel - COMPLETE METABOLIC PANEL WITH GFR   Moniqua Engebretsen [name] [name] [facility] 06/16/2017 8:48 AM

## 2017-09-25 ENCOUNTER — Ambulatory Visit (INDEPENDENT_AMBULATORY_CARE_PROVIDER_SITE_OTHER): Payer: Medicare Other | Admitting: Family Medicine

## 2017-09-25 ENCOUNTER — Encounter: Payer: Self-pay | Admitting: Family Medicine

## 2017-09-25 VITALS — BP 120/60 | HR 67 | Resp 14 | Ht 62.0 in | Wt 164.9 lb

## 2017-09-25 DIAGNOSIS — G629 Polyneuropathy, unspecified: Secondary | ICD-10-CM

## 2017-09-25 DIAGNOSIS — L299 Pruritus, unspecified: Secondary | ICD-10-CM

## 2017-09-25 MED ORDER — PREGABALIN 75 MG PO CAPS: 75.0000 mg | ORAL_CAPSULE | Freq: Two times a day (BID) | ORAL | 0 refills | Status: AC

## 2017-09-25 MED ORDER — PREGABALIN 50 MG PO CAPS: 50.0000 mg | ORAL_CAPSULE | Freq: Two times a day (BID) | ORAL | 0 refills | Status: AC

## 2017-09-25 MED ORDER — GABAPENTIN 300 MG PO CAPS: 300.0000 mg | ORAL_CAPSULE | Freq: Three times a day (TID) | ORAL | 0 refills | Status: AC

## 2017-09-25 MED ORDER — PREGABALIN 25 MG PO CAPS: 25.0000 mg | ORAL_CAPSULE | Freq: Two times a day (BID) | ORAL | 0 refills | Status: AC

## 2017-09-25 MED ORDER — GABAPENTIN 600 MG PO TABS: 600.0000 mg | ORAL_TABLET | Freq: Three times a day (TID) | ORAL | 0 refills | Status: AC

## 2017-09-25 NOTE — Progress Notes (Signed)
Name: [name]   MRN: [id number]    DOB: 11-26-1950   Date:09/25/2017       Progress Note  Subjective  Chief Complaint  Chief Complaint  Patient presents with  . Medication Refill  . Hair/Scalp Problem    scalp has been itching, came in contact with nieces that had head lice, she has wash and used lice treatment and did not see any lice    HPI  Peripheral Neuropathy:  Patient has peripheral neuropathy, s/p right below knee amputation, she has burning and tingling pain sensation in the right lower leg, she has been on Lyrica 150 mg BID which has not been effective, she wishes to switch back to Gabapentin which was taking before at a dosage of 600 mg TID.   Patient would also like to have her scalp and hair examined for possible lice, she has been complaining of significant itching in her scalp, her niece reportedly had head lice and she is worried she may have contracted the head lice from her niece. She has used an over-the-counter lice treatment  Past Medical History:  Diagnosis Date  . Complication of anesthesia   . Headache(784.0)    non post menopausal  . Heart murmur    for years, not heard now  . History of blood transfusion    post back surgeries- '90's  . Hypertension   . Neuropathic bladder   . PONV (postoperative nausea and vomiting)   . Scoliosis   . UTI (lower urinary tract infection)    Dut to neuropathic bladder    Past Surgical History:  Procedure Laterality Date  . AMPUTATION Right 01/13/2014   Procedure: AMPUTATION BELOW RIGHT KNEE;  Surgeon: Toni Arthurs, MD;  Location: [facility] OR;  Service: Orthopedics;  Laterality: Right;  . ANKLE FUSION Right 2013   tendon release right 2nd toe  . ANTERIOR AND POSTERIOR SPINAL FUSION  1995   L5- S  . ANTERIOR RELEASE VERTEBRAL BODY W/ POSTERIOR FUSION  1991   2 surgeries   . BACK SURGERY    . bone biospy Right    ankle  . BREAST SURGERY Left 1990   biospy non cancer  . CHOLECYSTECTOMY  1991  . DILATION AND  CURETTAGE OF UTERUS  2004  . ELBOW WEDGE OSTEOTOMY  2005   L4  . HARDWARE REMOVAL  2006   T5  . Hardware replaced  2005  . HYSTEROSCOPY  2004  . JOINT REPLACEMENT Right 2008   hip  . ORIF METATARSAL FRACTURE Right 2011   1 and 2  . P/P fusion Right 2010   2nd, 3rd, 4th  . REVISION TOTAL HIP ARTHROPLASTY Right 2011  . seportinoplasty  1980  . TENDON RELEASE Right 2011  . TONSILLECTOMY  1973  . TOTAL HIP ARTHROPLASTY Left 08/02/2015   Procedure: TOTAL HIP ARTHROPLASTY;  Surgeon: Donato Heinz, MD;  Location: [facility] ORS;  Service: Orthopedics;  Laterality: Left;    Family History  Problem Relation Age of Onset  . Hypertension Mother   . Hypertension Father   . CAD Father   . Hypertension Son   . Obesity Sister   . Obesity Brother   . Hypertension Sister     Social History   Social History  . Marital status: Married    Spouse name: N/A  . Number of children: N/A  . Years of education: N/A   Occupational History  . Not on file.   Social History Main Topics  . Smoking status:  Never Smoker  . Smokeless tobacco: Never Used  . Alcohol use No  . Drug use: No  . Sexual activity: Yes   Other Topics Concern  . Not on file   Social History Narrative  . No narrative on file     Current Outpatient Prescriptions:  .  atenolol (TENORMIN) 25 MG tablet, Take 1 tablet (25 mg total) by mouth every morning., Disp: 90 tablet, Rfl: 0 .  conjugated estrogens (PREMARIN) vaginal cream, Pea sized amount to urethra 3 times weekly at bedtime., Disp: 42.5 g, Rfl: 12 .  diphenhydramine-acetaminophen (TYLENOL PM) 25-500 MG TABS, Take 2 tablets by mouth at bedtime., Disp: , Rfl:  .  Incontinence Supply Disposable (BLADDER CONTROL PADS EX ABSORB) MISC, , Disp: , Rfl:  .  pregabalin (LYRICA) 150 MG capsule, Take 1 capsule (150 mg total) by mouth 2 (two) times daily., Disp: 180 capsule, Rfl: 0 .  senna (SENOKOT) 8.6 MG TABS tablet, Take 2 tablets by mouth daily., Disp: , Rfl:  .   triamterene-hydrochlorothiazide (DYAZIDE) 37.5-25 MG capsule, TAKE 1 TABLET EVERY MORNING, Disp: 90 capsule, Rfl: 0 .  scopolamine (TRANSDERM-SCOP) 1 MG/3DAYS, Place 1 patch (1.5 mg total) onto the skin every 3 (three) days. (Patient not taking: Reported on 09/25/2017), Disp: 10 patch, Rfl: 0  Allergies  Allergen Reactions  . Dilaudid [Hydromorphone] Shortness Of Breath    Respiratory arrest Respiratory arrest  . Benzoin Rash  . Other Rash    "Old kind of Adhesive Tape".  . Nsaids     Other reaction(s): Kidney Disorder Renal failure  . Tape Rash     ROS  Please see history of present illness for complete discussion of ROS  Objective  Vitals:   09/25/17 1120  BP: 120/60  Pulse: 67  Resp: 14  SpO2: 97%  Weight: 164 lb 14.4 oz (74.8 kg)  Height: 5\' 2"  (1.575 m)    Physical Exam  Constitutional: She is oriented to person, place, and time and well-developed, well-nourished, and in no distress.  HENT:  Head: Normocephalic and atraumatic.  Cardiovascular: Normal rate, regular rhythm and normal heart sounds.   No murmur heard. Pulmonary/Chest: Effort normal and breath sounds normal. She has no wheezes.  Neurological: She is alert and oriented to person, place, and time.  Psychiatric: Mood, memory, affect and judgment normal.  Nursing note and vitals reviewed.     Assessment & Plan  1. Peripheral polyneuropathy DC Lyrica by slow taper, and then start gabapentin 300 mg up to 3 times daily and then advancing to 600 mg up to 3 times daily, prescription sent to pharmacy - pregabalin (LYRICA) 75 MG capsule; Take 1 capsule (75 mg total) by mouth 2 (two) times daily.  Dispense: 6 capsule; Refill: 0 - pregabalin (LYRICA) 50 MG capsule; Take 1 capsule (50 mg total) by mouth 2 (two) times daily.  Dispense: 4 capsule; Refill: 0 - pregabalin (LYRICA) 25 MG capsule; Take 1 capsule (25 mg total) by mouth 2 (two) times daily.  Dispense: 4 capsule; Refill: 0 - gabapentin (NEURONTIN) 300  MG capsule; Take 1 capsule (300 mg total) by mouth 3 (three) times daily. 300 mg qd x 1 day, then 300 mg bid x 1 day, then 300 mg tid x 1 day  Dispense: 6 capsule; Refill: 0 - gabapentin (NEURONTIN) 600 MG tablet; Take 1 tablet (600 mg total) by mouth 3 (three) times daily. 600 mg qd x 2 days, 600 mg bid x 2 days, 600 mg tid onwards  Dispense:  270 tablet; Refill: 0  2. Scalp itch On visual inspection and exam, no head lice were found, scalp appears normal, patient reassured  Bettyjo Lundblad [name] [name] [facility] 09/25/2017 11:33 AM

## 2017-10-07 ENCOUNTER — Ambulatory Visit: Payer: Medicare Other | Admitting: Urology

## 2018-04-16 ENCOUNTER — Other Ambulatory Visit: Payer: Self-pay

## 2018-04-16 DIAGNOSIS — I10 Essential (primary) hypertension: Secondary | ICD-10-CM

## 2018-04-16 MED ORDER — ATENOLOL 25 MG PO TABS: 25.0000 mg | ORAL_TABLET | Freq: Every morning | ORAL | 0 refills | Status: AC

## 2018-04-16 NOTE — Telephone Encounter (Signed)
Pt has moved and has new primary care

## 2018-04-16 NOTE — Telephone Encounter (Signed)
Patient has not had her labs drawn in over two years Dr. Sherryll Burger ordered labs last April and again last July She no showed for appointment in November She needs an appointment I'll refill the atenolol for 30 days locally (not mail order), but will not refill the other until I know her renal function and electrolyte status
